# Patient Record
Sex: Female | Born: 1980 | Race: Black or African American | Hispanic: No | Marital: Single | Smoking: Never smoker
Health system: Southern US, Community
[De-identification: ages and names within clinical notes are randomized; demographics above are authoritative.]

## PROBLEM LIST (undated history)

## (undated) ENCOUNTER — Emergency Department (HOSPITAL_BASED_OUTPATIENT_CLINIC_OR_DEPARTMENT_OTHER): Discharge status: Absent without leave | Payer: No Typology Code available for payment source

## (undated) ENCOUNTER — Emergency Department (HOSPITAL_BASED_OUTPATIENT_CLINIC_OR_DEPARTMENT_OTHER): Admission: EM | Payer: No Typology Code available for payment source | Source: Home / Self Care

## (undated) DIAGNOSIS — G43909 Migraine, unspecified, not intractable, without status migrainosus: Secondary | ICD-10-CM

## (undated) HISTORY — PX: TUBAL LIGATION: SHX77

## (undated) HISTORY — PX: HERNIA REPAIR: SHX51

---
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
## 2002-09-10 ENCOUNTER — Other Ambulatory Visit: Admission: RE | Admit: 2002-09-10 | Discharge: 2002-09-10 | Payer: Self-pay | Admitting: Nephrology

## 2003-09-07 ENCOUNTER — Other Ambulatory Visit: Admission: RE | Admit: 2003-09-07 | Discharge: 2003-09-07 | Payer: Self-pay | Admitting: Nephrology

## 2009-09-22 ENCOUNTER — Emergency Department (HOSPITAL_BASED_OUTPATIENT_CLINIC_OR_DEPARTMENT_OTHER): Admission: EM | Admit: 2009-09-22 | Discharge: 2009-09-22 | Payer: Self-pay | Admitting: Emergency Medicine

## 2009-09-22 ENCOUNTER — Ambulatory Visit: Payer: Self-pay | Admitting: Interventional Radiology

## 2010-11-24 ENCOUNTER — Encounter: Payer: Self-pay | Admitting: *Deleted

## 2010-11-24 DIAGNOSIS — M2669 Other specified disorders of temporomandibular joint: Secondary | ICD-10-CM | POA: Insufficient documentation

## 2010-11-24 DIAGNOSIS — Z79899 Other long term (current) drug therapy: Secondary | ICD-10-CM | POA: Insufficient documentation

## 2010-11-24 DIAGNOSIS — J45909 Unspecified asthma, uncomplicated: Secondary | ICD-10-CM | POA: Insufficient documentation

## 2010-11-24 DIAGNOSIS — B9789 Other viral agents as the cause of diseases classified elsewhere: Secondary | ICD-10-CM | POA: Insufficient documentation

## 2010-11-24 NOTE — ED Notes (Signed)
Pt sts she has had sore throat and bilat ear pain x1.5 weeks. Pt also c/o bilat knee pain after falling on Tuesday.

## 2010-11-25 ENCOUNTER — Emergency Department (HOSPITAL_BASED_OUTPATIENT_CLINIC_OR_DEPARTMENT_OTHER)
Admission: EM | Admit: 2010-11-25 | Discharge: 2010-11-25 | Disposition: A | Discharge status: Home / Self Care | Payer: Self-pay | Attending: Emergency Medicine | Admitting: Emergency Medicine

## 2010-11-25 DIAGNOSIS — B349 Viral infection, unspecified: Secondary | ICD-10-CM

## 2010-11-25 DIAGNOSIS — M26629 Arthralgia of temporomandibular joint, unspecified side: Secondary | ICD-10-CM

## 2010-11-25 MED ORDER — CYCLOBENZAPRINE HCL 10 MG PO TABS: 10.0000 mg | ORAL_TABLET | Freq: Three times a day (TID) | ORAL | Status: AC | PRN

## 2010-11-25 MED ORDER — HYDROCODONE-ACETAMINOPHEN 5-325 MG PO TABS: 1.0000 | ORAL_TABLET | Freq: Four times a day (QID) | ORAL | Status: AC | PRN

## 2010-11-25 NOTE — ED Notes (Signed)
Pt presents to ED today with c/o bilat ear pain and sore throat for the past week.  Pt reports no home meds PTA.

## 2010-11-25 NOTE — ED Provider Notes (Signed)
History     CSN: [id number] Arrival date & time: 11/25/2010 12:41 AM  Chief Complaint  Patient presents with  . Sore Throat    (Consider location/radiation/quality/duration/timing/severity/associated sxs/prior treatment) HPI This is a 30 year old black female with about a week and a half history of sore throat. The sore throat has been persistent. It was accompanied by cough and nasal congestion and it is exacerbated by swallowing. She's had a decreased appetite. She also complains of bilateral ear pain which on further questioning is actually bilateral temporomandibular joint pain. Sore throat is moderate. She also fell 4 days ago injuring both knees. She is having pain under her patellas bilaterally. She is able to ambulate.  Past Medical History  Diagnosis Date  . Asthma     Past Surgical History  Procedure Date  . Hernia repair   . Tubal ligation     No family history on file.  History  Substance Use Topics  . Smoking status: Never Smoker   . Smokeless tobacco: Not on file  . Alcohol Use: No    OB History    Grav Para Term Preterm Abortions TAB SAB Ect Mult Living                  Review of Systems  All other systems reviewed and are negative.    Allergies  Review of patient's allergies indicates no known allergies.  Home Medications   Current Outpatient Rx  Name Route Sig Dispense Refill  . BUDESONIDE-FORMOTEROL FUMARATE 160-4.5 MCG/ACT IN AERO Inhalation Inhale 2 puffs into the lungs 2 (two) times daily.      Marland Kitchen VITAMIN D HIGH POTENCY PO Oral Take 1 capsule by mouth daily.      Marland Kitchen CITALOPRAM HYDROBROMIDE 10 MG PO TABS Oral Take 10 mg by mouth daily. Unknown dose     . SUMATRIPTAN SUCCINATE 6 MG/0.5ML  SOLN Subcutaneous Inject 6 mg into the skin every 2 (two) hours as needed. For migraine      . TOPIRAMATE 25 MG PO TABS Oral Take 100 mg by mouth at bedtime.       BP 118/73  Pulse 63  Temp(Src) 98 F (36.7 C) (Oral)  Resp 18  SpO2 99%  LMP  11/20/2010  Physical Exam General: Well-developed, well-nourished female in no acute distress; appearance consistent with age of record HENT: normocephalic, atraumatic; TMs pearly gray with light reflex bilaterally; no pharyngeal exudate or erythema; bilateral temporomandibular joint tenderness and pain with movement of jaw Eyes: Normal appearance  Neck: supple; no cervical lymphadenopathy Heart: regular rate and rhythm; no murmurs, rubs or gallops Lungs: clear to auscultation bilaterally Abdomen: soft; nontender; nondistended Extremities: No deformity; full range of motion; mild patellar tenderness bilaterally without swelling or deformity Neurologic: Awake, alert and oriented;motor function intact in all extremities and symmetric; no facial droop Skin: Warm and dry   ED Course  Procedures (including critical care time)    MDM          Hanley Seamen, MD 11/25/10 2130

## 2012-11-17 ENCOUNTER — Emergency Department (HOSPITAL_BASED_OUTPATIENT_CLINIC_OR_DEPARTMENT_OTHER)
Admission: EM | Admit: 2012-11-17 | Discharge: 2012-11-18 | Disposition: A | Discharge status: Home / Self Care | Payer: Non-veteran care | Attending: Emergency Medicine | Admitting: Emergency Medicine

## 2012-11-17 ENCOUNTER — Encounter (HOSPITAL_BASED_OUTPATIENT_CLINIC_OR_DEPARTMENT_OTHER): Payer: Self-pay | Admitting: *Deleted

## 2012-11-17 DIAGNOSIS — G43909 Migraine, unspecified, not intractable, without status migrainosus: Secondary | ICD-10-CM | POA: Insufficient documentation

## 2012-11-17 DIAGNOSIS — Z79899 Other long term (current) drug therapy: Secondary | ICD-10-CM | POA: Insufficient documentation

## 2012-11-17 DIAGNOSIS — J45909 Unspecified asthma, uncomplicated: Secondary | ICD-10-CM | POA: Insufficient documentation

## 2012-11-17 DIAGNOSIS — IMO0002 Reserved for concepts with insufficient information to code with codable children: Secondary | ICD-10-CM | POA: Insufficient documentation

## 2012-11-17 DIAGNOSIS — R5381 Other malaise: Secondary | ICD-10-CM | POA: Insufficient documentation

## 2012-11-17 HISTORY — DX: Migraine, unspecified, not intractable, without status migrainosus: G43.909

## 2012-11-17 MED ORDER — KETOROLAC TROMETHAMINE 30 MG/ML IJ SOLN
30.0000 mg | Freq: Once | INTRAMUSCULAR | Status: AC
  Administered 2012-11-17: 30 mg via INTRAVENOUS
  Filled 2012-11-17: qty 1

## 2012-11-17 MED ORDER — SODIUM CHLORIDE 0.9 % IV BOLUS (SEPSIS)
1000.0000 mL | INTRAVENOUS | Status: AC
  Administered 2012-11-17: 1000 mL via INTRAVENOUS

## 2012-11-17 MED ORDER — PROCHLORPERAZINE EDISYLATE 5 MG/ML IJ SOLN
10.0000 mg | Freq: Once | INTRAMUSCULAR | Status: DC
  Filled 2012-11-17: qty 2

## 2012-11-17 MED ORDER — DIPHENHYDRAMINE HCL 50 MG/ML IJ SOLN
25.0000 mg | Freq: Once | INTRAMUSCULAR | Status: AC
  Administered 2012-11-17: 25 mg via INTRAVENOUS
  Filled 2012-11-17: qty 1

## 2012-11-17 MED ORDER — METOCLOPRAMIDE HCL 5 MG/ML IJ SOLN
10.0000 mg | Freq: Once | INTRAMUSCULAR | Status: AC
  Administered 2012-11-17: 10 mg via INTRAVENOUS

## 2012-11-17 MED ORDER — METOCLOPRAMIDE HCL 5 MG/ML IJ SOLN
INTRAMUSCULAR | Status: AC
  Filled 2012-11-17: qty 2

## 2012-11-17 NOTE — ED Notes (Signed)
Pt reports migraine HA that began today, pt admits to nausea denies vomiting. Pt w/ hx of migraines, pt states "she hasn't taken her medication for migraines in a while."

## 2012-11-17 NOTE — ED Provider Notes (Signed)
CSN: [id number]     Arrival date & time 11/17/12  2013 History   First MD Initiated Contact with Patient 11/17/12 2239     Chief Complaint  Patient presents with  . Migraine   (Consider location/radiation/quality/duration/timing/severity/associated sxs/prior Treatment) HPI Pt is a 32yo female with hx of migraines c/o gradual onset headache that started behind both eyes. Pain is constant, aching, throbbing, radiating to back of head, 5/10 now, was 9/10 earlier today. Light makes pain worse. States HA feels like previous migraines.  Normally uses a Imitrex but states she did not have any with her today.  Did not try any pain medication PTA.  Pt does have a neurologist she follows up with but it has been several months since the last time she needed to come to ER for pain medication. Denies recent head trauma. Denies fever, n/v/d.  Past Medical History  Diagnosis Date  . Asthma   . Migraines    Past Surgical History  Procedure Laterality Date  . Hernia repair    . Tubal ligation     History reviewed. No pertinent family history. History  Substance Use Topics  . Smoking status: Never Smoker   . Smokeless tobacco: Not on file  . Alcohol Use: No   OB History   Grav Para Term Preterm Abortions TAB SAB Ect Mult Living                 Review of Systems  Constitutional: Positive for fatigue. Negative for fever, chills and diaphoresis.  Eyes: Positive for photophobia. Negative for pain and visual disturbance.  Respiratory: Negative for shortness of breath.   Neurological: Positive for headaches. Negative for dizziness, syncope, weakness, light-headedness and numbness.  All other systems reviewed and are negative.    Allergies  Review of patient's allergies indicates no known allergies.  Home Medications   Current Outpatient Rx  Name  Route  Sig  Dispense  Refill  . budesonide-formoterol (SYMBICORT) 160-4.5 MCG/ACT inhaler   Inhalation   Inhale 2 puffs into the lungs 2 (two)  times daily.           . Cholecalciferol (VITAMIN D HIGH POTENCY PO)   Oral   Take 1 capsule by mouth daily.           . citalopram (CELEXA) 10 MG tablet   Oral   Take 10 mg by mouth daily. Unknown dose          . SUMAtriptan (IMITREX) 6 MG/0.5ML SOLN   Subcutaneous   Inject 6 mg into the skin every 2 (two) hours as needed. For migraine           . topiramate (TOPAMAX) 25 MG tablet   Oral   Take 100 mg by mouth at bedtime.           BP 127/92  Pulse 72  Temp(Src) 98.5 F (36.9 C) (Oral)  Resp 16  Ht 5\' 1"  (1.549 m)  Wt 133 lb (60.328 kg)  BMI 25.14 kg/m2  SpO2 100%  LMP 11/10/2012 Physical Exam  Nursing note and vitals reviewed. Constitutional: She is oriented to person, place, and time. She appears well-developed and well-nourished. No distress.  Pt lying in exam bed, blanket over head.   HENT:  Head: Normocephalic and atraumatic.  Eyes: Conjunctivae and EOM are normal. Pupils are equal, round, and reactive to light. Right eye exhibits no discharge. Left eye exhibits no discharge. No scleral icterus.  Neck: Normal range of motion. Neck supple.  No  nuchal rigidity or meningeal signs.   Cardiovascular: Normal rate, regular rhythm and normal heart sounds.   Pulmonary/Chest: Effort normal and breath sounds normal. No respiratory distress. She has no wheezes. She has no rales. She exhibits no tenderness.  Abdominal: Soft. Bowel sounds are normal. She exhibits no distension and no mass. There is no tenderness. There is no rebound and no guarding.  Musculoskeletal: Normal range of motion.  Neurological: She is alert and oriented to person, place, and time. No cranial nerve deficit.  Skin: Skin is warm and dry. She is not diaphoretic.    ED Course  Procedures (including critical care time) Labs Review Labs Reviewed - No data to display Imaging Review No results found.  MDM   1. Migraine    Pt presenting with HA, describes as same as previous migraines.   Not concerned for CVA/TIA or SAH. Neuro exam: nl.  Gave migraine cocktail: Tx-Reglan, benadryl, Toradol and fluids.  Pt stated she felt much better and felt comfortable being discharged home.    All questions answered and concerns addressed. Will discharge pt home and have pt f/u with [facility] info provided, and previously established neurologist. Return precautions given. Pt verbalized understanding and agreement with tx plan. Vitals: unremarkable. Discharged in stable condition.       Junius Finner, PA-C 11/18/12 [phone]

## 2012-11-18 NOTE — ED Provider Notes (Signed)
Medical screening examination/treatment/procedure(s) were performed by non-physician practitioner and as supervising physician I was immediately available for consultation/collaboration.   Rolan Bucco, MD 11/18/12 [phone]

## 2013-05-11 ENCOUNTER — Encounter (HOSPITAL_COMMUNITY): Payer: Self-pay | Admitting: Emergency Medicine

## 2013-05-11 ENCOUNTER — Emergency Department (HOSPITAL_COMMUNITY)
Admission: EM | Admit: 2013-05-11 | Discharge: 2013-05-11 | Disposition: A | Discharge status: Home / Self Care | Payer: Non-veteran care | Attending: Emergency Medicine | Admitting: Emergency Medicine

## 2013-05-11 DIAGNOSIS — Z79899 Other long term (current) drug therapy: Secondary | ICD-10-CM | POA: Insufficient documentation

## 2013-05-11 DIAGNOSIS — J45909 Unspecified asthma, uncomplicated: Secondary | ICD-10-CM | POA: Insufficient documentation

## 2013-05-11 DIAGNOSIS — IMO0002 Reserved for concepts with insufficient information to code with codable children: Secondary | ICD-10-CM | POA: Insufficient documentation

## 2013-05-11 DIAGNOSIS — G43909 Migraine, unspecified, not intractable, without status migrainosus: Secondary | ICD-10-CM | POA: Insufficient documentation

## 2013-05-11 MED ORDER — METOCLOPRAMIDE HCL 5 MG/ML IJ SOLN
10.0000 mg | Freq: Once | INTRAMUSCULAR | Status: AC
  Administered 2013-05-11: 10 mg via INTRAVENOUS
  Filled 2013-05-11: qty 2

## 2013-05-11 MED ORDER — DIPHENHYDRAMINE HCL 50 MG/ML IJ SOLN
12.5000 mg | Freq: Once | INTRAMUSCULAR | Status: AC
  Administered 2013-05-11: 12.5 mg via INTRAVENOUS
  Filled 2013-05-11: qty 1

## 2013-05-11 MED ORDER — DEXAMETHASONE SODIUM PHOSPHATE 10 MG/ML IJ SOLN
10.0000 mg | Freq: Once | INTRAMUSCULAR | Status: AC
  Administered 2013-05-11: 10 mg via INTRAVENOUS
  Filled 2013-05-11: qty 1

## 2013-05-11 MED ORDER — KETOROLAC TROMETHAMINE 30 MG/ML IJ SOLN
30.0000 mg | Freq: Once | INTRAMUSCULAR | Status: AC
  Administered 2013-05-11: 30 mg via INTRAVENOUS
  Filled 2013-05-11: qty 1

## 2013-05-11 MED ORDER — SODIUM CHLORIDE 0.9 % IV BOLUS (SEPSIS)
1000.0000 mL | Freq: Once | INTRAVENOUS | Status: AC
  Administered 2013-05-11: 1000 mL via INTRAVENOUS

## 2013-05-11 NOTE — Discharge Instructions (Signed)
Migraine Headache A migraine headache is an intense, throbbing pain on one or both sides of your head. A migraine can last for 30 minutes to several hours. CAUSES  The exact cause of a migraine headache is not always known. However, a migraine may be caused when nerves in the brain become irritated and release chemicals that cause inflammation. This causes pain. Certain things may also trigger migraines, such as:  Alcohol.  Smoking.  Stress.  Menstruation.  Aged cheeses.  Foods or drinks that contain nitrates, glutamate, aspartame, or tyramine.  Lack of sleep.  Chocolate.  Caffeine.  Hunger.  Physical exertion.  Fatigue.  Medicines used to treat chest pain (nitroglycerine), birth control pills, estrogen, and some blood pressure medicines. SIGNS AND SYMPTOMS  Pain on one or both sides of your head.  Pulsating or throbbing pain.  Severe pain that prevents daily activities.  Pain that is aggravated by any physical activity.  Nausea, vomiting, or both.  Dizziness.  Pain with exposure to bright lights, loud noises, or activity.  General sensitivity to bright lights, loud noises, or smells. Before you get a migraine, you may get warning signs that a migraine is coming (aura). An aura may include:  Seeing flashing lights.  Seeing bright spots, halos, or zig-zag lines.  Having tunnel vision or blurred vision.  Having feelings of numbness or tingling.  Having trouble talking.  Having muscle weakness. DIAGNOSIS  A migraine headache is often diagnosed based on:  Symptoms.  Physical exam.  A CT scan or MRI of your head. These imaging tests cannot diagnose migraines, but they can help rule out other causes of headaches. TREATMENT Medicines may be given for pain and nausea. Medicines can also be given to help prevent recurrent migraines.  HOME CARE INSTRUCTIONS  Only take over-the-counter or prescription medicines for pain or discomfort as directed by your  health care provider. The use of long-term narcotics is not recommended.  Lie down in a dark, quiet room when you have a migraine.  Keep a journal to find out what may trigger your migraine headaches. For example, write down:  What you eat and drink.  How much sleep you get.  Any change to your diet or medicines.  Limit alcohol consumption.  Quit smoking if you smoke.  Get 7 9 hours of sleep, or as recommended by your health care provider.  Limit stress.  Keep lights dim if bright lights bother you and make your migraines worse. SEEK IMMEDIATE MEDICAL CARE IF:   Your migraine becomes severe.  You have a fever.  You have a stiff neck.  You have vision loss.  You have muscular weakness or loss of muscle control.  You start losing your balance or have trouble walking.  You feel faint or pass out.  You have severe symptoms that are different from your first symptoms. MAKE SURE YOU:   Understand these instructions.  Will watch your condition.  Will get help right away if you are not doing well or get worse. Document Released: 01/29/2005 Document Revised: 11/19/2012 Document Reviewed: 10/06/2012 [employer] Patient Information 2014 [employer].  

## 2013-05-11 NOTE — ED Provider Notes (Signed)
TIME SEEN: 7:15 PM  CHIEF COMPLAINT: Migraine headache  HPI: Patient is a 33 year old female with a history of migraine headaches and asthma who presents emergency department with complaints of gradual onset right-sided sharp headache is consistent with her prior migraines. She normally takes Imitrex at home for her migraines but states she did not take this medication because she was at work today. She tried taking Tylenol with no relief. She is having photophobia and nausea and vomiting. She states she feels numbness in her hands and feet bilaterally. No focal weakness. No history of head injury. She is not on anticoagulation. No fevers, neck pain or neck stiffness.  ROS: See HPI Constitutional: no fever  Eyes: no drainage  ENT: no runny nose   Cardiovascular:  no chest pain  Resp: no SOB  GI: no vomiting GU: no dysuria Integumentary: no rash  Allergy: no hives  Musculoskeletal: no leg swelling  Neurological: no slurred speech ROS otherwise negative  PAST MEDICAL HISTORY/PAST SURGICAL HISTORY:  Past Medical History  Diagnosis Date  . Asthma   . Migraines     MEDICATIONS:  Prior to Admission medications   Medication Sig Start Date End Date Taking? Authorizing Provider  budesonide-formoterol (SYMBICORT) 160-4.5 MCG/ACT inhaler Inhale 2 puffs into the lungs 2 (two) times daily.      Historical Provider, MD  Cholecalciferol (VITAMIN D HIGH POTENCY PO) Take 1 capsule by mouth daily.      Historical Provider, MD  citalopram (CELEXA) 10 MG tablet Take 10 mg by mouth daily. Unknown dose     Historical Provider, MD  SUMAtriptan (IMITREX) 6 MG/0.5ML SOLN Inject 6 mg into the skin every 2 (two) hours as needed. For migraine      Historical Provider, MD  topiramate (TOPAMAX) 25 MG tablet Take 100 mg by mouth at bedtime.     Historical Provider, MD    ALLERGIES:  No Known Allergies  SOCIAL HISTORY:  History  Substance Use Topics  . Smoking status: Never Smoker   . Smokeless tobacco:  Not on file  . Alcohol Use: No    FAMILY HISTORY: No family history on file.  EXAM: BP 142/86  Pulse 86  Temp(Src) 98.4 F (36.9 C) (Oral)  Resp 22  SpO2 99% CONSTITUTIONAL: Alert and oriented and responds appropriately to questions. Well-appearing; well-nourished HEAD: Normocephalic EYES: Conjunctivae clear, PERRL ENT: normal nose; no rhinorrhea; moist mucous membranes; pharynx without lesions noted NECK: Supple, no meningismus, no LAD  CARD: RRR; S1 and S2 appreciated; no murmurs, no clicks, no rubs, no gallops RESP: Normal chest excursion without splinting or tachypnea; breath sounds clear and equal bilaterally; no wheezes, no rhonchi, no rales,  ABD/GI: Normal bowel sounds; non-distended; soft, non-tender, no rebound, no guarding BACK:  The back appears normal and is non-tender to palpation, there is no CVA tenderness EXT: Normal ROM in all joints; non-tender to palpation; no edema; normal capillary refill; no cyanosis    SKIN: Normal color for age and race; warm NEURO: Moves all extremities equally, sensation to light touch intact diffusely, cranial nerves II through XII intact, patient has poor cooperation with exam PSYCH: Grooming and personal hygiene are appropriate. Patient demonstrates histrionic behavior.  MEDICAL DECISION MAKING: Patient here with her typical migraine headache. She has been in the emergency department before for similar headache and received Reglan, Toradol, Benadryl, IV fluids with good relief. We'll repeat these medications today. I am not concerned for any infectious etiology, intracranial hemorrhage or stroke. She is neurologically intact on  exam.  ED PROGRESS: Patient reports her headache is now completely gone after migraine cocktail. She states she has Imitrex at home. We'll discharge patient home with return precautions and supportive care instructions. She verbalized understanding is comfortable with this plan.     Layla Maw Ward, DO 05/11/13  2041

## 2013-05-11 NOTE — ED Notes (Signed)
Pt c/o of migraine that started 9am. Hx of same. Nausea, photosensitivity, tingling arms/legs.

## 2013-05-11 NOTE — ED Notes (Signed)
Bed: WA07 Expected date:  Expected time:  Means of arrival:  Comments: EMS 

## 2015-01-08 ENCOUNTER — Emergency Department (HOSPITAL_BASED_OUTPATIENT_CLINIC_OR_DEPARTMENT_OTHER)
Admission: EM | Admit: 2015-01-08 | Discharge: 2015-01-09 | Disposition: A | Discharge status: Home / Self Care | Payer: Non-veteran care | Attending: Emergency Medicine | Admitting: Emergency Medicine

## 2015-01-08 ENCOUNTER — Encounter (HOSPITAL_BASED_OUTPATIENT_CLINIC_OR_DEPARTMENT_OTHER): Payer: Self-pay | Admitting: Adult Health

## 2015-01-08 DIAGNOSIS — Z79899 Other long term (current) drug therapy: Secondary | ICD-10-CM | POA: Diagnosis not present

## 2015-01-08 DIAGNOSIS — Z8679 Personal history of other diseases of the circulatory system: Secondary | ICD-10-CM | POA: Diagnosis not present

## 2015-01-08 DIAGNOSIS — M79641 Pain in right hand: Secondary | ICD-10-CM | POA: Diagnosis not present

## 2015-01-08 DIAGNOSIS — M79642 Pain in left hand: Secondary | ICD-10-CM | POA: Diagnosis present

## 2015-01-08 DIAGNOSIS — G629 Polyneuropathy, unspecified: Secondary | ICD-10-CM | POA: Diagnosis not present

## 2015-01-08 DIAGNOSIS — J45909 Unspecified asthma, uncomplicated: Secondary | ICD-10-CM | POA: Diagnosis not present

## 2015-01-08 MED ORDER — HYDROCODONE-ACETAMINOPHEN 5-325 MG PO TABS: 1.0000 | ORAL_TABLET | Freq: Four times a day (QID) | ORAL | Status: DC | PRN

## 2015-01-08 NOTE — Discharge Instructions (Signed)

## 2015-01-08 NOTE — ED Provider Notes (Signed)
CSN: [id number]     Arrival date & time 01/08/15  2210 History  By signing my name below, I, Budd PalmerVanessa Prueter, attest that this documentation has been prepared under the direction and in the presence of Paula LibraJohn Nija Koopman, MD. Electronically Signed: Budd PalmerVanessa Prueter, ED Scribe. 01/08/2015. 11:48 PM.     Chief Complaint  Patient presents with  . Hand Pain   The history is provided by the patient. No language interpreter was used.   HPI Comments: Ashley Bell is a 34 y.o. female who presents to the Emergency Department complaining of worsening, constant, bilateral hand pain, numbness, paresthesias and weakness onset 2 months ago. She describes the paresthesias as "needles sticking into" her arms. Symptoms are much worse on the left and she notes that her left hand "feels ice cold" and that her right hand has occasional sharp pains. The pain and paresthesias are in the glovelike pattern. Pain is worse with palpation or movement. She rates her pain as a 9 out of 10 at its worst. She also reports that she can "barely wring out a washcloth" with her left hand. Pt denies numbness or weakness in the face trunk and lower extremities.   Past Medical History  Diagnosis Date  . Asthma   . Migraines    Past Surgical History  Procedure Laterality Date  . Hernia repair    . Tubal ligation     History reviewed. No pertinent family history. Social History  Substance Use Topics  . Smoking status: Never Smoker   . Smokeless tobacco: None  . Alcohol Use: No   OB History    No data available     Review of Systems  All other systems reviewed and are negative.   Allergies  Review of patient's allergies indicates no known allergies.  Home Medications   Prior to Admission medications   Medication Sig Start Date End Date Taking? Authorizing Provider  ALBUTEROL IN Inhale into the lungs.   Yes Historical Provider, MD  CHLORTHALIDONE PO Take by mouth.   Yes Historical Provider, MD  Loratadine (CLARITIN PO)  Take by mouth.   Yes Historical Provider, MD  potassium chloride SA (K-DUR,KLOR-CON) 20 MEQ tablet Take 40 mEq by mouth 2 (two) times daily.   Yes Historical Provider, MD  HYDROcodone-acetaminophen (NORCO/VICODIN) 5-325 MG tablet Take 1-2 tablets by mouth every 6 (six) hours as needed (for pain). 01/08/15   Mackie Holness, MD   BP 155/103 mmHg  Pulse 73  Temp(Src) 98.4 F (36.9 C) (Oral)  Resp 18  Ht 5\' 1"  (1.549 m)  Wt 172 lb 1 oz (78.047 kg)  BMI 32.53 kg/m2  SpO2 100%  LMP 11/16/2014 (Approximate)   Physical Exam General: Well-developed, well-nourished female in no acute distress; appearance consistent with age of record HENT: normocephalic; atraumatic Eyes: normal appearance Neck: supple Heart: regular rate and rhythm Lungs: clear to auscultation bilaterally Abdomen: soft; nondistended; nontender Extremities: No deformity; full range of motion except left hand and wrist limited by muscle weakness Neurologic: Awake, alert and oriented; no facial droop; +3/5 strength in the L hand, +5/5 strength in the right hand; decreased sensation of the left hand and wrist in a glove-like pattern Skin: Warm and dry Psychiatric: Normal mood and affect   ED Course  Procedures   MDM  The patient was advised that her symptoms are suggestive of a neuropathy and definitive diagnosis will require evaluation by a neurologist. We will refer for follow-up and treat her pain in the meantime.  Final diagnoses:  Peripheral polyneuropathy (HCC)   I personally performed the services described in this documentation, which was scribed in my presence. The recorded information has been reviewed and is accurate.   Paula Libra, MD 01/08/15 [phone]

## 2015-01-08 NOTE — ED Notes (Signed)
Presents with bilateral hand pain, numbness and difficulty with dexterity and picking up things. Began 2 months ago.

## 2015-01-17 ENCOUNTER — Emergency Department (HOSPITAL_BASED_OUTPATIENT_CLINIC_OR_DEPARTMENT_OTHER)
Admission: EM | Admit: 2015-01-17 | Discharge: 2015-01-17 | Disposition: A | Discharge status: Home / Self Care | Payer: No Typology Code available for payment source | Attending: Emergency Medicine | Admitting: Emergency Medicine

## 2015-01-17 ENCOUNTER — Emergency Department (HOSPITAL_BASED_OUTPATIENT_CLINIC_OR_DEPARTMENT_OTHER): Payer: No Typology Code available for payment source

## 2015-01-17 ENCOUNTER — Encounter (HOSPITAL_BASED_OUTPATIENT_CLINIC_OR_DEPARTMENT_OTHER): Payer: Self-pay | Admitting: *Deleted

## 2015-01-17 DIAGNOSIS — S59901A Unspecified injury of right elbow, initial encounter: Secondary | ICD-10-CM | POA: Insufficient documentation

## 2015-01-17 DIAGNOSIS — S161XXA Strain of muscle, fascia and tendon at neck level, initial encounter: Secondary | ICD-10-CM | POA: Diagnosis not present

## 2015-01-17 DIAGNOSIS — J45909 Unspecified asthma, uncomplicated: Secondary | ICD-10-CM | POA: Insufficient documentation

## 2015-01-17 DIAGNOSIS — Z8679 Personal history of other diseases of the circulatory system: Secondary | ICD-10-CM | POA: Diagnosis not present

## 2015-01-17 DIAGNOSIS — S39012A Strain of muscle, fascia and tendon of lower back, initial encounter: Secondary | ICD-10-CM

## 2015-01-17 DIAGNOSIS — S199XXA Unspecified injury of neck, initial encounter: Secondary | ICD-10-CM | POA: Diagnosis present

## 2015-01-17 DIAGNOSIS — M62830 Muscle spasm of back: Secondary | ICD-10-CM | POA: Diagnosis not present

## 2015-01-17 DIAGNOSIS — Z79899 Other long term (current) drug therapy: Secondary | ICD-10-CM | POA: Insufficient documentation

## 2015-01-17 DIAGNOSIS — S299XXA Unspecified injury of thorax, initial encounter: Secondary | ICD-10-CM | POA: Insufficient documentation

## 2015-01-17 DIAGNOSIS — Y9389 Activity, other specified: Secondary | ICD-10-CM | POA: Insufficient documentation

## 2015-01-17 DIAGNOSIS — Y92410 Unspecified street and highway as the place of occurrence of the external cause: Secondary | ICD-10-CM | POA: Insufficient documentation

## 2015-01-17 DIAGNOSIS — Y998 Other external cause status: Secondary | ICD-10-CM | POA: Diagnosis not present

## 2015-01-17 MED ORDER — HYDROCODONE-ACETAMINOPHEN 5-325 MG PO TABS
1.0000 | ORAL_TABLET | Freq: Once | ORAL | Status: AC
  Administered 2015-01-17: 1 via ORAL
  Filled 2015-01-17: qty 1

## 2015-01-17 MED ORDER — HYDROCODONE-ACETAMINOPHEN 5-325 MG PO TABS: 1.0000 | ORAL_TABLET | Freq: Four times a day (QID) | ORAL | Status: DC | PRN

## 2015-01-17 MED ORDER — CYCLOBENZAPRINE HCL 10 MG PO TABS
10.0000 mg | ORAL_TABLET | Freq: Once | ORAL | Status: AC
  Administered 2015-01-17: 10 mg via ORAL
  Filled 2015-01-17: qty 1

## 2015-01-17 MED ORDER — CYCLOBENZAPRINE HCL 10 MG PO TABS: 10.0000 mg | ORAL_TABLET | Freq: Two times a day (BID) | ORAL | Status: DC | PRN
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
## 2015-01-17 NOTE — Discharge Instructions (Signed)

## 2015-06-18 ENCOUNTER — Ambulatory Visit (HOSPITAL_COMMUNITY)
Admission: EM | Admit: 2015-06-18 | Discharge: 2015-06-18 | Discharge status: Against Medical Advice | Payer: Non-veteran care | Attending: Family Medicine | Admitting: Family Medicine

## 2015-06-18 ENCOUNTER — Encounter (HOSPITAL_COMMUNITY): Payer: Self-pay | Admitting: Emergency Medicine

## 2015-06-18 DIAGNOSIS — G43909 Migraine, unspecified, not intractable, without status migrainosus: Secondary | ICD-10-CM | POA: Diagnosis not present

## 2015-06-18 NOTE — Discharge Instructions (Signed)
Recurrent Migraine Headache A migraine headache is an intense, throbbing pain on one or both sides of your head. Recurrent migraines keep coming back. A migraine can last for 30 minutes to several hours. CAUSES  The exact cause of a migraine headache is not always known. However, a migraine may be caused when nerves in the brain become irritated and release chemicals that cause inflammation. This causes pain. Certain things may also trigger migraines, such as:   Alcohol.  Smoking.  Stress.  Menstruation.  Aged cheeses.  Foods or drinks that contain nitrates, glutamate, aspartame, or tyramine.  Lack of sleep.  Chocolate.  Caffeine.  Hunger.  Physical exertion.  Fatigue.  Medicines used to treat chest pain (nitroglycerine), birth control pills, estrogen, and some blood pressure medicines. SYMPTOMS   Pain on one or both sides of your head.  Pulsating or throbbing pain.  Severe pain that prevents daily activities.  Pain that is aggravated by any physical activity.  Nausea, vomiting, or both.  Dizziness.  Pain with exposure to bright lights, loud noises, or activity.  General sensitivity to bright lights, loud noises, or smells. Before you get a migraine, you may get warning signs that a migraine is coming (aura). An aura may include:  Seeing flashing lights.  Seeing bright spots, halos, or zigzag lines.  Having tunnel vision or blurred vision.  Having feelings of numbness or tingling.  Having trouble talking.  Having muscle weakness. DIAGNOSIS  A recurrent migraine headache is often diagnosed based on:  Symptoms.  Physical examination.  A CT scan or MRI of your head. These imaging tests cannot diagnose migraines but can help rule out other causes of headaches.  TREATMENT  Medicines may be given for pain and nausea. Medicines can also be given to help prevent recurrent migraines. HOME CARE INSTRUCTIONS  Only take over-the-counter or prescription  medicines for pain or discomfort as directed by your health care provider. The use of long-term narcotics is not recommended.  Lie down in a dark, quiet room when you have a migraine.  Keep a journal to find out what may trigger your migraine headaches. For example, write down:  What you eat and drink.  How much sleep you get.  Any change to your diet or medicines.  Limit alcohol consumption.  Quit smoking if you smoke.  Get 7-9 hours of sleep, or as recommended by your health care provider.  Limit stress.  Keep lights dim if bright lights bother you and make your migraines worse. SEEK MEDICAL CARE IF:   You do not get relief from the medicines given to you.  You have a recurrence of pain.  You have a fever. SEEK IMMEDIATE MEDICAL CARE IF:  Your migraine becomes severe.  You have a stiff neck.  You have loss of vision.  You have muscular weakness or loss of muscle control.  You start losing your balance or have trouble walking.  You feel faint or pass out.  You have severe symptoms that are different from your first symptoms. MAKE SURE YOU:   Understand these instructions.  Will watch your condition.  Will get help right away if you are not doing well or get worse.   This information is not intended to replace advice given to you by your health care provider. Make sure you discuss any questions you have with your health care provider.   Document Released: 10/24/2000 Document Revised: 02/19/2014 Document Reviewed: 10/06/2012 Elsevier Interactive Patient Education 2016 [employer]  

## 2015-06-18 NOTE — ED Provider Notes (Signed)
CSN: [id number]     Arrival date & time 06/18/15  1946 History   First MD Initiated Contact with Patient 06/18/15 2022     Chief Complaint  Patient presents with  . Migraine   (Consider location/radiation/quality/duration/timing/severity/associated sxs/prior Treatment) HPI Comments: 35 year old female complaining of a migraine headache for one week. She states it got worse at 12:00 today. She has a treating physician who provides her with Vicodin for her headaches. She states it is not helping. She takes no other medications for her migraines. Reading in past records she has been treated with Imitrex but apparently that is no longer part of her treatment plan now. She has associated photophobia with nausea and vomiting. Denies problems with vision, speech, hearing, swallowing, focal weakness or paresthesias. Denies chest pain, shortness of breath. She states this is not the worst headache of her life.  During the interview the patient did not move. She remained lying right lateral recumbent. She speaks softly and some of her speech is difficult to understand. Surprisingly the patient did not allow me to examine her. She refused to move, sit on the end of the table or cooperate in any other way. Patient states that she was physically incapable of sitting up or ambulating due to her headache syndrome. She states that if she were to try to sit up this would only "piss me off".   Past Medical History  Diagnosis Date  . Asthma   . Migraines    Past Surgical History  Procedure Laterality Date  . Hernia repair    . Tubal ligation     No family history on file. Social History  Substance Use Topics  . Smoking status: Never Smoker   . Smokeless tobacco: None  . Alcohol Use: No   OB History    No data available     Review of Systems  Constitutional: Positive for activity change. Negative for fever and chills.  HENT: Negative for congestion, sinus pressure and trouble swallowing.   Eyes:  Positive for photophobia. Negative for visual disturbance.  Respiratory: Negative.  Negative for cough and shortness of breath.   Cardiovascular: Negative for chest pain.  Gastrointestinal: Positive for nausea and vomiting.  Genitourinary: Negative.   Musculoskeletal: Positive for neck pain.  Skin: Negative.   Neurological: Positive for weakness and headaches. Negative for dizziness, seizures, syncope, light-headedness and numbness.  Psychiatric/Behavioral: Negative for confusion.    Allergies  Review of patient's allergies indicates no known allergies.  Home Medications   Prior to Admission medications   Medication Sig Start Date End Date Taking? Authorizing Provider  HYDROcodone-acetaminophen (NORCO/VICODIN) 5-325 MG tablet Take 1-2 tablets by mouth every 6 (six) hours as needed. 01/17/15  Yes Gwyneth SproutWhitney Plunkett, MD  ALBUTEROL IN Inhale into the lungs.    Historical Provider, MD  CHLORTHALIDONE PO Take by mouth.    Historical Provider, MD  cyclobenzaprine (FLEXERIL) 10 MG tablet Take 1 tablet (10 mg total) by mouth 2 (two) times daily as needed for muscle spasms. 01/17/15   Gwyneth SproutWhitney Plunkett, MD  Loratadine (CLARITIN PO) Take by mouth.    Historical Provider, MD  potassium chloride SA (K-DUR,KLOR-CON) 20 MEQ tablet Take 40 mEq by mouth 2 (two) times daily.    Historical Provider, MD   Meds Ordered and Administered this Visit  Medications - No data to display  BP 149/90 mmHg  Pulse 68  Temp(Src) 97.7 F (36.5 C) (Oral)  Resp 16  SpO2 98%  LMP 06/18/2015 No data found.  Physical Exam  Constitutional: She is oriented to person, place, and time. She appears well-developed and well-nourished.  Cardiovascular: Normal rate.   Pulmonary/Chest: Effort normal. No respiratory distress.  Neurological: She is alert and oriented to person, place, and time.  Psychiatric: Her affect is blunt and inappropriate. Her speech is delayed. She is slowed and withdrawn. Cognition and memory are not  impaired. She expresses impulsivity and inappropriate judgment.  Nursing note and vitals reviewed.   ED Course  Procedures (including critical care time)  Labs Review Labs Reviewed - No data to display  Imaging Review No results found.   Visual Acuity Review  Right Eye Distance:   Left Eye Distance:   Bilateral Distance:    Right Eye Near:   Left Eye Near:    Bilateral Near:         MDM   1. Migraine without status migrainosus, not intractable, unspecified migraine type    The examiner remained in the room while the patient was making a decision as to what to do. I advised her that it was necessary to perform a physical/neurologic exam prior to administering medications and letting her go home. She remained silent and maintained her right lateral recumbent position. She would occasionally mumble something that I could not understand but her children apparently could. I explained that I was not refusing treatment at all but that I was concerned that since she was unable to sit on the side of the table or perform any of the task of the neurologic exam that I was concerned that some other condition may exist. I advised that I was willing to offer her medication injection similar to what she had received in the emergency department previously but again advised that it was necessary to perform the exam. She was also given the choice to go to the emergency department for additional evaluation and management. This might be the best option since she does not have the ability to ambulate, sit on the exam table or perform small task during the neuro exam. After the patient had remained silent for a while she told her children that she just wanted to go home. She continued to refuse to move or allow the exam to occur. At this point her children stated that they would take her home. Her son put her shoes on her feet and at some point with assistance she was able to get into a wheelchair and let  her children take her to the car. This was her choice.  The patient left without examination or pharmacologic treatment.   Hayden Rasmussen, NP 06/18/15 2130

## 2015-06-18 NOTE — ED Notes (Signed)
C/o constant migraine HA onset 1200 today associated w/n/v/d Reports she gets botox inj every 10 weeks A&O x4... No acute distress.

## 2016-03-24 ENCOUNTER — Emergency Department (HOSPITAL_BASED_OUTPATIENT_CLINIC_OR_DEPARTMENT_OTHER)
Admission: EM | Admit: 2016-03-24 | Discharge: 2016-03-24 | Disposition: A | Discharge status: Home / Self Care | Payer: BLUE CROSS/BLUE SHIELD | Attending: Emergency Medicine | Admitting: Emergency Medicine

## 2016-03-24 ENCOUNTER — Encounter (HOSPITAL_BASED_OUTPATIENT_CLINIC_OR_DEPARTMENT_OTHER): Payer: Self-pay | Admitting: *Deleted

## 2016-03-24 ENCOUNTER — Emergency Department (HOSPITAL_BASED_OUTPATIENT_CLINIC_OR_DEPARTMENT_OTHER): Payer: BLUE CROSS/BLUE SHIELD

## 2016-03-24 DIAGNOSIS — M25562 Pain in left knee: Secondary | ICD-10-CM | POA: Insufficient documentation

## 2016-03-24 DIAGNOSIS — J45909 Unspecified asthma, uncomplicated: Secondary | ICD-10-CM | POA: Diagnosis not present

## 2016-03-24 DIAGNOSIS — M25569 Pain in unspecified knee: Secondary | ICD-10-CM

## 2016-03-24 DIAGNOSIS — G8929 Other chronic pain: Secondary | ICD-10-CM | POA: Insufficient documentation

## 2016-03-24 MED ORDER — TRAMADOL HCL 50 MG PO TABS: 50.0000 mg | ORAL_TABLET | Freq: Four times a day (QID) | ORAL | 0 refills | Status: DC | PRN

## 2016-03-24 NOTE — ED Provider Notes (Signed)
[facility] Provider Note   CSN: [id number] Arrival date & time: 03/24/16  1215     History   Chief Complaint Chief Complaint  Patient presents with  . Knee Pain    HPI Levin ErpShanta Wagler is a 36 y.o. female.  HPI Levin ErpShanta Casebolt is a 36 yo female with history of HTN, asthma and migraine who presents with left knee pain. Reports knee pain for "years". She says it started hurting when she was in [employer]. She denies history of trauma or injury. She says the pain has gradually gotten worse over the last one year. Denies change over the last one week or month. She says "I am tired of waking up with pain and came to be checked". She says pain awakens her up from sleep. Pain is achy and sore. Pain is worse with standing, sitting and walking. She also reports some spasm in her calf. Also reports swelling about two weeks ago that has resolved on its own. She states using tylenol and ibuprofen for pain without improvement.  She denies fever, chills, skin redness, fatigue or other joint pain.  Past Medical History:  Diagnosis Date  . Asthma   . Migraines     There are no active problems to display for this patient.   Past Surgical History:  Procedure Laterality Date  . HERNIA REPAIR    . HERNIA REPAIR    . TUBAL LIGATION      OB History    No data available       Home Medications    Prior to Admission medications   Medication Sig Start Date End Date Taking? Authorizing Provider  traMADol (ULTRAM) 50 MG tablet Take 1 tablet (50 mg total) by mouth every 6 (six) hours as needed. 03/24/16   Almon Herculesaye T Gonfa, MD    Family History No family history on file.  Social History Social History  Substance Use Topics  . Smoking status: Never Smoker  . Smokeless tobacco: Never Used  . Alcohol use No     Allergies   Patient has no known allergies.   Review of Systems Review of Systems  Constitutional: Negative.  Negative for appetite change, fever and unexpected weight  change.  HENT: Negative for dental problem, hearing loss, sore throat and trouble swallowing.   Eyes: Negative for visual disturbance.  Respiratory: Negative for cough, chest tightness, shortness of breath and wheezing.   Cardiovascular: Negative for chest pain, palpitations and leg swelling.  Gastrointestinal: Negative for abdominal pain, blood in stool and constipation.  Endocrine: Negative for cold intolerance and heat intolerance.  Genitourinary: Negative for dyspareunia, dysuria, genital sores, hematuria and vaginal bleeding.  Musculoskeletal: Positive for arthralgias.       Left knee pain  Skin: Negative for rash.  Neurological: Negative for weakness, numbness and headaches.  Hematological: Negative for adenopathy. Does not bruise/bleed easily.  Psychiatric/Behavioral: Negative for dysphoric mood and sleep disturbance. The patient is not nervous/anxious.    Physical Exam Updated Vital Signs BP 155/99 (BP Location: Right Arm)   Pulse 71   Temp 98.2 F (36.8 C) (Oral)   Resp 20   Ht 5\' 2"  (1.575 m)   Wt 84.8 kg   LMP 03/18/2016   SpO2 100%   BMI 34.20 kg/m   Physical Exam GEN: appears well, no apparent distress. HEM: negative for cervical or periauricular lymphadenopathies CVS: RRR, nl s1 & s2, no murmurs, no edema,  2+ DP & PT pulses bilaterally, cap refills < 2 secs RESP:  no IWOB, CTAB GI: BS present & normal, soft, NTND MSK:   Left knee No apparent deformities or skin lesion. Appears symmetric compared to right. No increased warmth. Limited range of motion due to pain, mainly over the medials aspect of her left knee. Tender to palpation, mainly over MCL. No baker's cyst. Pain with valgus and varus stresses, but more with valgus. No effusion, bulge/balloon sign. Walked to radiology room for x-ray although with antalgic gait SKIN: no apparent skin lesion  NEURO: alert and oiented appropriately, no gross defecits  PSYCH: euthymic mood with congruent affect   ED  Treatments / Results  Labs (all labs ordered are listed, but only abnormal results are displayed) Labs Reviewed - No data to display  EKG  EKG Interpretation None       Radiology Dg Knee Complete 4 Views Left  Result Date: 03/24/2016 CLINICAL DATA:  Chronic left knee pain for 8 years. No known injury. EXAM: LEFT KNEE - COMPLETE 4+ VIEW COMPARISON:  08/28/2012 FINDINGS: No evidence of fracture, dislocation, or joint effusion. No evidence of arthropathy or other focal bone abnormality. Soft tissues are unremarkable. IMPRESSION: Negative. Electronically Signed   By: Elige Ko   On: 03/24/2016 16:27    Procedures Procedures (including critical care time)  Medications Ordered in ED Medications - No data to display   Initial Impression / Assessment and Plan / ED Course  I have reviewed the triage vital signs and the nursing notes.  Pertinent labs & imaging results that were available during my care of the patient were reviewed by me and considered in my medical decision making (see chart for details).  Left knee pain concerning for MCL injury. She also has crepitus when examined by Dr. Dalene Seltzer which is concerning for OA or meniscal tear. DG-knee negative for osseous or soft tissue abnormality. I am wondering if her tenderness is out of proportion. She didn't let me flex or extend her knee but was able to walk to radiology although with antalgic gait. Low suspicion for septic joint without constitutional symptoms.  Gave prescription for tramadol 50 mg #10. Recommended follow up with sport medicine. Gave her phone numbers.  [place] narcotic data base reviewed and no red flag.  Final Clinical Impressions(s) / ED Diagnoses   Final diagnoses:  Chronic pain of left knee    New Prescriptions Discharge Medication List as of 03/24/2016  4:48 PM    START taking these medications   Details  traMADol (ULTRAM) 50 MG tablet Take 1 tablet (50 mg total) by mouth every 6 (six) hours as needed.,  Starting Sat 03/24/2016, Print         Almon Hercules, MD 03/24/16 1610    Alvira Monday, MD 03/28/16 [phone]

## 2016-03-24 NOTE — Discharge Instructions (Addendum)
It is nice taking care of you today! Your knee pain is likely due to osteoarthritis or soft tissue injury Your X-ray did not show fracture or injury We gave you  prescription for tramadol to help with the pain. We also recommend trying ice or heat.  We recommend follow-up with sports medicine doctor. Please call and make an appointment as soon as possible.

## 2016-03-24 NOTE — ED Triage Notes (Signed)
Left knee pain x years.  Worsening over the last 8-9 months.  Ambulatory with steady gain and slight limp.

## 2016-03-27 ENCOUNTER — Encounter (HOSPITAL_BASED_OUTPATIENT_CLINIC_OR_DEPARTMENT_OTHER): Payer: Self-pay | Admitting: *Deleted

## 2019-02-25 ENCOUNTER — Emergency Department (HOSPITAL_BASED_OUTPATIENT_CLINIC_OR_DEPARTMENT_OTHER): Payer: No Typology Code available for payment source

## 2019-02-25 ENCOUNTER — Encounter (HOSPITAL_BASED_OUTPATIENT_CLINIC_OR_DEPARTMENT_OTHER): Payer: Self-pay | Admitting: *Deleted

## 2019-02-25 ENCOUNTER — Emergency Department (HOSPITAL_BASED_OUTPATIENT_CLINIC_OR_DEPARTMENT_OTHER)
Admission: EM | Admit: 2019-02-25 | Discharge: 2019-02-25 | Disposition: A | Discharge status: Home / Self Care | Payer: No Typology Code available for payment source | Attending: Emergency Medicine | Admitting: Emergency Medicine

## 2019-02-25 ENCOUNTER — Other Ambulatory Visit: Payer: Self-pay

## 2019-02-25 DIAGNOSIS — I10 Essential (primary) hypertension: Secondary | ICD-10-CM | POA: Diagnosis not present

## 2019-02-25 DIAGNOSIS — R0602 Shortness of breath: Secondary | ICD-10-CM | POA: Diagnosis not present

## 2019-02-25 DIAGNOSIS — J90 Pleural effusion, not elsewhere classified: Secondary | ICD-10-CM | POA: Diagnosis not present

## 2019-02-25 DIAGNOSIS — G43909 Migraine, unspecified, not intractable, without status migrainosus: Secondary | ICD-10-CM | POA: Insufficient documentation

## 2019-02-25 DIAGNOSIS — R0789 Other chest pain: Secondary | ICD-10-CM | POA: Diagnosis present

## 2019-02-25 DIAGNOSIS — J45909 Unspecified asthma, uncomplicated: Secondary | ICD-10-CM | POA: Insufficient documentation

## 2019-02-25 DIAGNOSIS — Z79899 Other long term (current) drug therapy: Secondary | ICD-10-CM | POA: Diagnosis not present

## 2019-02-25 DIAGNOSIS — R079 Chest pain, unspecified: Secondary | ICD-10-CM

## 2019-02-25 LAB — CBC WITH DIFFERENTIAL/PLATELET
Abs Immature Granulocytes: 0.02 10*3/uL (ref 0.00–0.07)
Basophils Absolute: 0.1 10*3/uL (ref 0.0–0.1)
Basophils Relative: 1 %
Eosinophils Absolute: 0.1 10*3/uL (ref 0.0–0.5)
Eosinophils Relative: 2 %
HCT: 46.3 % — ABNORMAL HIGH (ref 36.0–46.0)
Hemoglobin: 14.8 g/dL (ref 12.0–15.0)
Immature Granulocytes: 0 %
Lymphocytes Relative: 43 %
Lymphs Abs: 3.9 10*3/uL (ref 0.7–4.0)
MCH: 27 pg (ref 26.0–34.0)
MCHC: 32 g/dL (ref 30.0–36.0)
MCV: 84.5 fL (ref 80.0–100.0)
Monocytes Absolute: 0.6 10*3/uL (ref 0.1–1.0)
Monocytes Relative: 7 %
Neutro Abs: 4.4 10*3/uL (ref 1.7–7.7)
Neutrophils Relative %: 47 %
Platelets: 340 10*3/uL (ref 150–400)
RBC: 5.48 MIL/uL — ABNORMAL HIGH (ref 3.87–5.11)
RDW: 13.2 % (ref 11.5–15.5)
WBC: 9.1 10*3/uL (ref 4.0–10.5)
nRBC: 0 % (ref 0.0–0.2)

## 2019-02-25 LAB — COMPREHENSIVE METABOLIC PANEL
ALT: 41 U/L (ref 0–44)
AST: 30 U/L (ref 15–41)
Albumin: 4.1 g/dL (ref 3.5–5.0)
Alkaline Phosphatase: 97 U/L (ref 38–126)
Anion gap: 9 (ref 5–15)
BUN: 8 mg/dL (ref 6–20)
CO2: 24 mmol/L (ref 22–32)
Calcium: 9.4 mg/dL (ref 8.9–10.3)
Chloride: 104 mmol/L (ref 98–111)
Creatinine, Ser: 0.59 mg/dL (ref 0.44–1.00)
GFR calc Af Amer: >60 mL/min (ref >60)
GFR calc non Af Amer: >60 mL/min (ref >60)
Glucose, Bld: 103 mg/dL — ABNORMAL HIGH (ref 70–99)
Potassium: 3.3 mmol/L — ABNORMAL LOW (ref 3.5–5.1)
Sodium: 137 mmol/L (ref 135–145)
Total Bilirubin: 0.5 mg/dL (ref 0.3–1.2)
Total Protein: 7.4 g/dL (ref 6.5–8.1)

## 2019-02-25 LAB — URINALYSIS, ROUTINE W REFLEX MICROSCOPIC
Bilirubin Urine: NEGATIVE
Glucose, UA: NEGATIVE mg/dL
Hgb urine dipstick: NEGATIVE
Ketones, ur: NEGATIVE mg/dL
Leukocytes,Ua: NEGATIVE
Nitrite: NEGATIVE
Protein, ur: NEGATIVE mg/dL
Specific Gravity, Urine: 1.01 (ref 1.005–1.030)
pH: 7 (ref 5.0–8.0)

## 2019-02-25 LAB — PREGNANCY, URINE: Preg Test, Ur: NEGATIVE

## 2019-02-25 LAB — BRAIN NATRIURETIC PEPTIDE: B Natriuretic Peptide: 56.2 pg/mL (ref 0.0–100.0)

## 2019-02-25 LAB — D-DIMER, QUANTITATIVE: D-Dimer, Quant: 0.62 ug/mL-FEU — ABNORMAL HIGH (ref 0.00–0.50)

## 2019-02-25 LAB — TROPONIN I (HIGH SENSITIVITY)
Troponin I (High Sensitivity): <2 ng/L (ref <18)
Troponin I (High Sensitivity): <2 ng/L (ref <18)

## 2019-02-25 MED ORDER — KETOROLAC TROMETHAMINE 30 MG/ML IJ SOLN: 30.0000 mg | Freq: Once | INTRAMUSCULAR | Status: DC

## 2019-02-25 MED ORDER — IOHEXOL 350 MG/ML SOLN
100.0000 mL | Freq: Once | INTRAVENOUS | Status: AC
  Administered 2019-02-25: 100 mL via INTRAVENOUS

## 2019-02-25 MED ORDER — ONDANSETRON HCL 4 MG/2ML IJ SOLN
4.0000 mg | Freq: Once | INTRAMUSCULAR | Status: AC
  Administered 2019-02-25: 4 mg via INTRAVENOUS
  Filled 2019-02-25: qty 2

## 2019-02-25 MED ORDER — METOCLOPRAMIDE HCL 5 MG/ML IJ SOLN
10.0000 mg | Freq: Once | INTRAMUSCULAR | Status: AC
  Administered 2019-02-25: 10 mg via INTRAVENOUS
  Filled 2019-02-25: qty 2

## 2019-02-25 MED ORDER — DEXAMETHASONE SODIUM PHOSPHATE 10 MG/ML IJ SOLN
10.0000 mg | Freq: Once | INTRAMUSCULAR | Status: AC
  Administered 2019-02-25: 21:00:00 10 mg via INTRAVENOUS
  Filled 2019-02-25: qty 1

## 2019-02-25 MED ORDER — MORPHINE SULFATE (PF) 2 MG/ML IV SOLN
2.0000 mg | Freq: Once | INTRAVENOUS | Status: AC
  Administered 2019-02-25: 2 mg via INTRAVENOUS
  Filled 2019-02-25: qty 1

## 2019-02-25 MED ORDER — DIPHENHYDRAMINE HCL 50 MG/ML IJ SOLN
25.0000 mg | Freq: Once | INTRAMUSCULAR | Status: AC
  Administered 2019-02-25: 17:00:00 25 mg via INTRAVENOUS
  Filled 2019-02-25: qty 1

## 2019-02-25 NOTE — ED Provider Notes (Signed)
[facility] EMERGENCY DEPARTMENT Provider Note   CSN: [id number] Arrival date & time: 02/25/19  1522     History Chief Complaint  Patient presents with   Headache    Ashley Bell is a 39 y.o. female past medical history of asthma, migraines who presents today for evaluation of multiple complaints.  Patient states that she is concerned about her blood pressure which she states has been elevated the last week.  She states she has measured it in the 150s-160s.  Patient also reports that about a week ago, she developed a mild headache that feels similar to her migraines.  She has associated photophobia but no vision changes.  She states that the headache is mostly right-sided and feels like it goes into her neck.  She has tried taking her medications at home with no improvement in her headache.  She states this feels typical to her past history of migraines.  She reports associated photophobia.  Patient also reports that over the last 3 days, she developed a chest tightness/heaviness but also feels like a pulling in pushing sensation.  She states it is mostly in the middle and left side of her chest.  She initially thought this was related to her asthma and tried taking her albuterol but did not have any improvement.  She states that this has been constant over the last 3 days.  She does report is worse with deep inspiration also notes it is worse when she walks.  No associated diaphoresis or nausea/vomiting.  She does state that she has had some shortness of breath over the last 3 days also.  She noted that when she walked from her car to the triage room here in the emergency department, she felt severely short of breath.  She has not noted any cough, congestion, fevers but has states she felt hot at home.  She has had lightheadedness that feels like she is going to pass out.  No room spinning or dizziness sensation.  She had one episode of abdominal pain yesterday that resolved on its own.  No  associated nausea/vomiting.  She does not smoke.  She does have history of hypertension and states that she was recently diagnosed with diabetes but has not been started on the medication.  No personal cardiac history or family history of MI before the age of 19.  She is not a smoker and denies any illicit drug use. She denies any OCP use, recent immobilization, prior history of DVT/PE, recent surgery, leg swelling, or long travel.  She denies any difficulty walking, numbness/weakness of arms or legs.  The history is provided by the patient.    HPI: A 39 year old patient with a history of hypertension presents for evaluation of chest pain. Initial onset of pain was more than 6 hours ago. The patient's chest pain is described as heaviness/pressure/tightness and is not worse with exertion. The patient's chest pain is middle- or left-sided, is not well-localized, is not sharp and does not radiate to the arms/jaw/neck. The patient does not complain of nausea and denies diaphoresis. The patient has no history of stroke, has no history of peripheral artery disease, has not smoked in the past 90 days, denies any history of treated diabetes, has no relevant family history of coronary artery disease (first degree relative at less than age 78), has no history of hypercholesterolemia and does not have an elevated BMI (>=30).   Past Medical History:  Diagnosis Date   Asthma    Migraines  There are no problems to display for this patient.   Past Surgical History:  Procedure Laterality Date   HERNIA REPAIR     HERNIA REPAIR     TUBAL LIGATION       OB History   No obstetric history on file.     History reviewed. No pertinent family history.  Social History   Tobacco Use   Smoking status: Never Smoker   Smokeless tobacco: Never Used  Substance Use Topics   Alcohol use: No   Drug use: No    Home Medications Prior to Admission medications   Medication Sig Start Date End Date  Taking? Authorizing Provider  traMADol (ULTRAM) 50 MG tablet Take 1 tablet (50 mg total) by mouth every 6 (six) hours as needed. 03/24/16   Almon HerculesGonfa, Taye T, MD  SUMAtriptan (IMITREX) 6 MG/0.5ML SOLN Inject 6 mg into the skin every 2 (two) hours as needed. For migraine    01/08/15  [provider]    Allergies    Other, Sumatriptan, and Lisinopril  Review of Systems   Review of Systems  Constitutional: Negative for fever.  Eyes: Positive for photophobia. Negative for visual disturbance.  Respiratory: Positive for shortness of breath. Negative for cough.   Cardiovascular: Positive for chest pain.  Gastrointestinal: Negative for abdominal pain, nausea and vomiting.  Genitourinary: Negative for dysuria and hematuria.  Neurological: Positive for light-headedness and headaches. Negative for weakness and numbness.  All other systems reviewed and are negative.   Physical Exam Updated Vital Signs BP (!) 148/88 (BP Location: Left Arm)    Pulse 72    Temp 98.4 F (36.9 C) (Oral)    Resp 20    LMP 02/22/2019    SpO2 98%   Physical Exam Vitals and nursing note reviewed.  Constitutional:      Appearance: Normal appearance. She is well-developed.     Comments: Sitting comfortably on examination table  HENT:     Head: Normocephalic and atraumatic.     Comments: PERRL. EOMs intact. No nystagmus. No neglect.  Eyes:     General: Lids are normal.     Conjunctiva/sclera: Conjunctivae normal.     Pupils: Pupils are equal, round, and reactive to light.  Neck:     Comments: Full range of motion with any difficulty.  Mild tenderness palpation noted to the posterior right paraspinal muscles of the cervical region.  No midline bony tenderness.  No deformity or crepitus noted.  Neck is supple and without rigidity. Cardiovascular:     Rate and Rhythm: Normal rate and regular rhythm.     Pulses: Normal pulses.          Radial pulses are 2+ on the right side and 2+ on the left side.       Dorsalis  pedis pulses are 2+ on the right side and 2+ on the left side.     Heart sounds: Normal heart sounds. No murmur. No friction rub. No gallop.   Pulmonary:     Effort: Pulmonary effort is normal.     Breath sounds: Normal breath sounds.     Comments: Lungs clear to auscultation bilaterally.  Symmetric chest rise.  No wheezing, rales, rhonchi. Chest:     Comments: Slight pain with palpation of the anterior chest wall.  No deformity or crepitus noted. Abdominal:     Palpations: Abdomen is soft. Abdomen is not rigid.     Tenderness: There is no abdominal tenderness. There is no guarding.  Comments: Abdomen is soft, non-distended, non-tender. No rigidity, No guarding. No peritoneal signs.  Musculoskeletal:        General: Normal range of motion.     Cervical back: Full passive range of motion without pain and neck supple.  Skin:    General: Skin is warm and dry.     Capillary Refill: Capillary refill takes less than 2 seconds.  Neurological:     Mental Status: She is alert and oriented to person, place, and time.     Comments: Cranial nerves III-XII intact Follows commands, Moves all extremities  5/5 strength to BUE and BLE  Sensation intact throughout all major nerve distributions No slurred speech. No facial droop.   Psychiatric:        Speech: Speech normal.     ED Results / Procedures / Treatments   Labs (all labs ordered are listed, but only abnormal results are displayed) Labs Reviewed  COMPREHENSIVE METABOLIC PANEL - Abnormal; Notable for the following components:      Result Value   Potassium 3.3 (*)    Glucose, Bld 103 (*)    All other components within normal limits  CBC WITH DIFFERENTIAL/PLATELET - Abnormal; Notable for the following components:   RBC 5.48 (*)    HCT 46.3 (*)    All other components within normal limits  D-DIMER, QUANTITATIVE (NOT AT [facility]) - Abnormal; Notable for the following components:   D-Dimer, Quant 0.62 (*)    All other components within  normal limits  PREGNANCY, URINE  URINALYSIS, ROUTINE W REFLEX MICROSCOPIC  BRAIN NATRIURETIC PEPTIDE  TROPONIN I (HIGH SENSITIVITY)  TROPONIN I (HIGH SENSITIVITY)    EKG EKG Interpretation  Date/Time:  Wednesday February 25 2019 16:18:24 EST Ventricular Rate:  69 PR Interval:    QRS Duration: 93 QT Interval:  406 QTC Calculation: 435 R Axis:   61 Text Interpretation: Sinus rhythm Low voltage, precordial leads Borderline T abnormalities, diffuse leads No STEMI Confirmed by Alvester Chou [phone]) on 02/25/2019 4:56:08 PM   Radiology DG Chest 2 View  Result Date: 02/25/2019 CLINICAL DATA:  Chest pain.  Hypertension. EXAM: CHEST - 2 VIEW COMPARISON:  01/27/2015 FINDINGS: Borderline cardiomegaly, new. Pulmonary vascularity is normal. Both lungs are clear. The visualized skeletal structures are unremarkable. IMPRESSION: New borderline cardiomegaly.  Otherwise normal exam. Electronically Signed   By: Francene Boyers M.D.   On: 02/25/2019 17:09   CT Angio Chest PE W and/or Wo Contrast  Result Date: 02/25/2019 CLINICAL DATA:  Shortness of breath. EXAM: CT ANGIOGRAPHY CHEST WITH CONTRAST TECHNIQUE: Multidetector CT imaging of the chest was performed using the standard protocol during bolus administration of intravenous contrast. Multiplanar CT image reconstructions and MIPs were obtained to evaluate the vascular anatomy. CONTRAST:  OMNIPAQUE IOHEXOL 350 MG/ML SOLN COMPARISON:  Chest x-ray from same day. CT chest report dated August 28, 2012. FINDINGS: Cardiovascular: Satisfactory opacification of the pulmonary arteries to the segmental level. No evidence of pulmonary embolism. Mild cardiomegaly. No pericardial effusion. No thoracic aortic aneurysm or dissection. Mediastinum/Nodes: No enlarged mediastinal, hilar, or axillary lymph nodes. Thyroid gland, trachea, and esophagus demonstrate no significant findings. Lungs/Pleura: Trace bilateral pleural effusions. No consolidation or pneumothorax. No  suspicious pulmonary nodule. Upper Abdomen: No acute abnormality. Musculoskeletal: No chest wall abnormality. No acute or significant osseous findings. Review of the MIP images confirms the above findings. IMPRESSION: 1.  No evidence of pulmonary embolism. 2. Mild cardiomegaly with trace bilateral pleural effusions. Electronically Signed   By: Vickki Hearing.D.  On: 02/25/2019 18:05    Procedures Procedures (including critical care time)  Medications Ordered in ED Medications  ondansetron (ZOFRAN) injection 4 mg (4 mg Intravenous Given 02/25/19 1640)  metoCLOPramide (REGLAN) injection 10 mg (10 mg Intravenous Given 02/25/19 1643)  diphenhydrAMINE (BENADRYL) injection 25 mg (25 mg Intravenous Given 02/25/19 1642)  morphine 2 MG/ML injection 2 mg (2 mg Intravenous Given 02/25/19 1645)  iohexol (OMNIPAQUE) 350 MG/ML injection 100 mL (100 mLs Intravenous Contrast Given 02/25/19 1748)  dexamethasone (DECADRON) injection 10 mg (10 mg Intravenous Given 02/25/19 2051)    ED Course  I have reviewed the triage vital signs and the nursing notes.  Pertinent labs & imaging results that were available during my care of the patient were reviewed by me and considered in my medical decision making (see chart for details).    MDM Rules/Calculators/A&P HEAR Score: 42                    39 year old female who presents for evaluation of multiple complaints today.  Reports concern of blood pressure over the last week as well as migraine headache that has been ongoing for the last week.  Also reports 3 days of chest pain, shortness of breath, lightheadedness sensation.  She is currently on amlodipine 5 mg which she states she has been compliant with. Patient is afebrile, non-toxic appearing, sitting comfortably on examination table. Vital signs reviewed and stable.  Concern for migraine that is causing her hypertension.  Doubt hypertensive emergency.  Low suspicion for ACS etiology but also consideration.  He does  not have any risk factors for PE but she does report pleuritic chest pain and feel like she is short of breath.  She is low risk but will obtain D-dimer.  History/physical exam is not concerning for CVA, intracranial hemorrhage, dural venous sinus thrombosis.  Plan to check labs, EKG, chest x-ray.  Will give migraine cocktail.  Initial troponin is negative.  CMP shows potassium of 3.3.  Normal BUN and creatinine.  CBC shows no leukocytosis.  Hemoglobin stable at 14.8.  D-dimer is slightly elevated at 0.62.  Chest x-ray shows no infectious etiology but does show a slight cardiomegaly.  Given elevated D-dimer, will plan for CT of chest.  CTA of chest shows no evidence of PE.  She has mild cardiomegaly with trace bilateral pleural effusions.  BNP is negative.  Given patient's history/risk factors, she has a heart score of 3.  Her delta troponin is negative.  I discussed results with patient.  She reports chest pain is improved significantly since being here in the emergency department as well as shortness of breath.  She states headache has significantly proved that she still has some lingering headache.  We will add on additional Toradol to her migraine cocktail.  At this time, patient is hemodynamically stable.  Her blood pressure 132/94.  I did discuss with patient regarding keeping a log of her blood pressures and having follow-up with her primary care doctor.  I discussed with her that she may be on too low dose of medication and she may need adjustment.  I discussed with her that given lack of follow-up here in the emergency department, any adjustment in her medications would be best done by her primary care doctor.  Patient is in agreement. At this time, patient exhibits no emergent life-threatening condition that require further evaluation in ED or admission. Patient had ample opportunity for questions and discussion. All patient's questions were answered with full understanding.  Strict return  precautions discussed. Patient expresses understanding and agreement to plan.   Portions of this note were generated with Scientist, clinical (histocompatibility and immunogenetics). Dictation errors may occur despite best attempts at proofreading.  Final Clinical Impression(s) / ED Diagnoses Final diagnoses:  Chest pain, unspecified type  Pleural effusion  Migraine without status migrainosus, not intractable, unspecified migraine type  Essential hypertension    Rx / DC Orders ED Discharge Orders    None       Rosana Hoes 02/25/19 2233    Terald Sleeper, MD 02/26/19 1124

## 2019-02-25 NOTE — Discharge Instructions (Addendum)
As we discussed, your work-up today was reassuring.  Her CT did not show any evidence of blood clots in your lungs but did show small amount of fluid in your lungs, noticed pleural effusions.  This needs to be followed up by your primary care doctor.  Additionally, as we discussed, you need to log a report of your blood pressure readings that you can follow-up with your primary care doctor.  Your medications may need to be adjusted.  Return to the emergency department for any worsening pain, difficulty breathing, numbness/weakness of your arms or legs or any other worsening or concerning symptoms.

## 2019-02-25 NOTE — ED Notes (Signed)
Pt unable to urinate at this time.  

## 2019-02-25 NOTE — ED Triage Notes (Signed)
Pt c/o h/a and increased BP x 1 week

## 2019-06-27 ENCOUNTER — Encounter (HOSPITAL_BASED_OUTPATIENT_CLINIC_OR_DEPARTMENT_OTHER): Payer: Self-pay | Admitting: Emergency Medicine

## 2019-06-27 ENCOUNTER — Other Ambulatory Visit: Payer: Self-pay

## 2019-06-27 ENCOUNTER — Emergency Department (HOSPITAL_BASED_OUTPATIENT_CLINIC_OR_DEPARTMENT_OTHER)
Admission: EM | Admit: 2019-06-27 | Discharge: 2019-06-27 | Disposition: A | Discharge status: Home / Self Care | Payer: No Typology Code available for payment source | Attending: Emergency Medicine | Admitting: Emergency Medicine

## 2019-06-27 DIAGNOSIS — J45909 Unspecified asthma, uncomplicated: Secondary | ICD-10-CM | POA: Insufficient documentation

## 2019-06-27 DIAGNOSIS — B029 Zoster without complications: Secondary | ICD-10-CM

## 2019-06-27 DIAGNOSIS — Z888 Allergy status to other drugs, medicaments and biological substances status: Secondary | ICD-10-CM | POA: Insufficient documentation

## 2019-06-27 DIAGNOSIS — R0789 Other chest pain: Secondary | ICD-10-CM | POA: Diagnosis present

## 2019-06-27 MED ORDER — OXYCODONE-ACETAMINOPHEN 10-325 MG PO TABS: 1.0000 | ORAL_TABLET | Freq: Four times a day (QID) | ORAL | 0 refills | Status: DC | PRN

## 2019-06-27 MED ORDER — FENTANYL CITRATE (PF) 100 MCG/2ML IJ SOLN
100.0000 ug | Freq: Once | INTRAMUSCULAR | Status: AC
  Administered 2019-06-27: 100 ug via INTRAVENOUS
  Filled 2019-06-27: qty 2

## 2019-06-27 MED ORDER — VALACYCLOVIR HCL 500 MG PO TABS
1000.0000 mg | ORAL_TABLET | Freq: Once | ORAL | Status: AC
  Administered 2019-06-27: 1000 mg via ORAL
  Filled 2019-06-27: qty 2

## 2019-06-27 MED ORDER — ONDANSETRON HCL 4 MG/2ML IJ SOLN
4.0000 mg | Freq: Once | INTRAMUSCULAR | Status: AC
  Administered 2019-06-27: 4 mg via INTRAVENOUS
  Filled 2019-06-27: qty 2

## 2019-06-27 MED ORDER — VALACYCLOVIR HCL 1 G PO TABS: 1000.0000 mg | ORAL_TABLET | Freq: Three times a day (TID) | ORAL | 0 refills | Status: AC

## 2019-06-27 NOTE — ED Provider Notes (Signed)
[facility] Provider Note: Lowella Dell, MD, FACEP  CSN: [id number] MRN: [id number] ARRIVAL: 06/27/19 at 0433 ROOM: [facility]05/[facility]05   CHIEF COMPLAINT  Chest Pain and Shoulder Pain   HISTORY OF PRESENT ILLNESS  06/27/19 4:59 AM Ashley Bell is a 39 y.o. female who awoke at 230 this morning with severe pain in her chest radiating around to her back.  It is in a dermatomal pattern passing under her left breast.  There is no associated rash yet.  There is associated hyperesthesia to even light touch.  She rates her pain as an 8 out of 10.  She has difficulty characterizing the pain and hyperesthesia but uses terms such as sharp and pressure-like.  It is worse also with movement or deep breathing although she is not short of breath.   Past Medical History:  Diagnosis Date  . Asthma   . Migraines     Past Surgical History:  Procedure Laterality Date  . HERNIA REPAIR    . HERNIA REPAIR    . TUBAL LIGATION      History reviewed. No pertinent family history.  Social History   Tobacco Use  . Smoking status: Never Smoker  . Smokeless tobacco: Never Used  Substance Use Topics  . Alcohol use: No  . Drug use: No    Prior to Admission medications   Medication Sig Start Date End Date Taking? Authorizing Provider  oxyCODONE-acetaminophen (PERCOCET) 10-325 MG tablet Take 1 tablet by mouth every 6 (six) hours as needed for pain (may cause constipation). 06/27/19   Montanna Mcbain, MD  valACYclovir (VALTREX) 1000 MG tablet Take 1 tablet (1,000 mg total) by mouth 3 (three) times daily. 06/27/19   Ara Grandmaison, MD  SUMAtriptan (IMITREX) 6 MG/0.5ML SOLN Inject 6 mg into the skin every 2 (two) hours as needed. For migraine    01/08/15  [provider]    Allergies Other, Sumatriptan, and Lisinopril   REVIEW OF SYSTEMS  Negative except as noted here or in the History of Present Illness.   PHYSICAL EXAMINATION  Initial Vital Signs Blood pressure (!) 172/106, pulse 79,  temperature 98 F (36.7 C), temperature source Oral, resp. rate 18, height 5\' 2"  (1.575 m), weight 93 kg, SpO2 99 %.  Examination General: Well-developed, well-nourished female in no acute distress; appearance consistent with age of record HENT: normocephalic; atraumatic Eyes: pupils equal, round and reactive to light; extraocular muscles intact Neck: supple Heart: regular rate and rhythm Lungs: clear to auscultation bilaterally Chest: Severe tenderness and hyperesthesia of the chest from the lower sternum around to the left subscapular region in a dermatomal pattern that does not involve her upper extremity, no associated rash seen Abdomen: soft; nondistended; nontender; no masses or hepatosplenomegaly; bowel sounds present Extremities: No deformity; full range of motion; pulses normal Neurologic: Awake, alert and oriented; motor function intact in all extremities and symmetric; no facial droop Skin: Warm and dry Psychiatric: Flat affect   RESULTS  Summary of this visit's results, reviewed and interpreted by myself:   EKG Interpretation  Date/Time:  Saturday Jun 27 2019 04:54:30 EDT Ventricular Rate:  79 PR Interval:    QRS Duration: 88 QT Interval:  382 QTC Calculation: 438 R Axis:   51 Text Interpretation: Sinus rhythm Low voltage, precordial leads Borderline T abnormalities, diffuse leads No significant change was found Confirmed by [birth year] (Paula Libra) on 06/27/2019 4:59:41 AM      Laboratory Studies: No results found for this or any previous visit (from the  past 24 hour(s)). Imaging Studies: No results found.  ED COURSE and MDM  Nursing notes, initial and subsequent vitals signs, including pulse oximetry, reviewed and interpreted by myself.  Vitals:   06/27/19 0444  BP: (!) 172/106  Pulse: 79  Resp: 18  Temp: 98 F (36.7 C)  TempSrc: Oral  SpO2: 99%  Weight: 93 kg  Height: 5\' 2"  (1.575 m)   Medications  valACYclovir (VALTREX) tablet 1,000 mg (has no  administration in time range)  ondansetron (ZOFRAN) injection 4 mg (4 mg Intravenous Given 06/27/19 0516)  fentaNYL (SUBLIMAZE) injection 100 mcg (100 mcg Intravenous Given 06/27/19 0516)    The patient's presentation is consistent with early shingles.  The pain and hyperesthesia are in a dermatomal pattern and no rashes yet present.  Patient was advised she will likely develop a rash and she will be potentially contagious for chickenpox to those who have not had the disease or the vaccine.  She is not contagious for shingles itself.  She is a [place] patient and will follow up with the [facility] for subsequent care but we will initiate antiviral and analgesic treatment.  PROCEDURES  Procedures   ED DIAGNOSES     ICD-10-CM   1. Herpes zoster without complication  P29.5        Audrey Thull, MD 06/27/19 [id number]

## 2019-06-27 NOTE — ED Triage Notes (Signed)
  Patient comes in with chest pains that started around 0230 this morning.  Patient states the pain woke her up and was in the middle of her chest radiating to her L shoulder.  No nausea or vomiting.  No diaphoresis.  Hurts more when taking a deep breath.  Pain 8/10.  Hx HTN.

## 2019-07-12 ENCOUNTER — Emergency Department (HOSPITAL_BASED_OUTPATIENT_CLINIC_OR_DEPARTMENT_OTHER)
Admission: EM | Admit: 2019-07-12 | Discharge: 2019-07-13 | Disposition: A | Discharge status: Home / Self Care | Payer: No Typology Code available for payment source | Attending: Emergency Medicine | Admitting: Emergency Medicine

## 2019-07-12 ENCOUNTER — Other Ambulatory Visit: Payer: Self-pay

## 2019-07-12 ENCOUNTER — Encounter (HOSPITAL_BASED_OUTPATIENT_CLINIC_OR_DEPARTMENT_OTHER): Payer: Self-pay | Admitting: Emergency Medicine

## 2019-07-12 DIAGNOSIS — I10 Essential (primary) hypertension: Secondary | ICD-10-CM | POA: Diagnosis not present

## 2019-07-12 DIAGNOSIS — R1013 Epigastric pain: Secondary | ICD-10-CM | POA: Insufficient documentation

## 2019-07-12 DIAGNOSIS — R0789 Other chest pain: Secondary | ICD-10-CM | POA: Insufficient documentation

## 2019-07-12 DIAGNOSIS — R1110 Vomiting, unspecified: Secondary | ICD-10-CM | POA: Insufficient documentation

## 2019-07-12 DIAGNOSIS — J45909 Unspecified asthma, uncomplicated: Secondary | ICD-10-CM | POA: Diagnosis not present

## 2019-07-12 DIAGNOSIS — K8020 Calculus of gallbladder without cholecystitis without obstruction: Secondary | ICD-10-CM | POA: Diagnosis not present

## 2019-07-12 DIAGNOSIS — R748 Abnormal levels of other serum enzymes: Secondary | ICD-10-CM | POA: Insufficient documentation

## 2019-07-12 DIAGNOSIS — R7989 Other specified abnormal findings of blood chemistry: Secondary | ICD-10-CM

## 2019-07-12 NOTE — ED Triage Notes (Signed)
Patient reports continuing to have chest pain in the center of her chest.  Also endorses nausea and feeling dizzy.  Ambulatory at triage.

## 2019-07-13 ENCOUNTER — Emergency Department (HOSPITAL_COMMUNITY)
Admission: EM | Admit: 2019-07-13 | Discharge: 2019-07-14 | Disposition: A | Discharge status: Home / Self Care | Payer: Medicaid Other | Attending: Emergency Medicine | Admitting: Emergency Medicine

## 2019-07-13 ENCOUNTER — Emergency Department (HOSPITAL_BASED_OUTPATIENT_CLINIC_OR_DEPARTMENT_OTHER): Payer: No Typology Code available for payment source

## 2019-07-13 ENCOUNTER — Emergency Department (HOSPITAL_BASED_OUTPATIENT_CLINIC_OR_DEPARTMENT_OTHER)
Admit: 2019-07-13 | Discharge: 2019-07-13 | Disposition: A | Discharge status: Home / Self Care | Payer: No Typology Code available for payment source | Attending: Emergency Medicine | Admitting: Emergency Medicine

## 2019-07-13 ENCOUNTER — Other Ambulatory Visit: Payer: Self-pay

## 2019-07-13 ENCOUNTER — Emergency Department (HOSPITAL_COMMUNITY): Payer: Medicaid Other

## 2019-07-13 DIAGNOSIS — R109 Unspecified abdominal pain: Secondary | ICD-10-CM

## 2019-07-13 DIAGNOSIS — K8020 Calculus of gallbladder without cholecystitis without obstruction: Secondary | ICD-10-CM | POA: Insufficient documentation

## 2019-07-13 DIAGNOSIS — R1010 Upper abdominal pain, unspecified: Secondary | ICD-10-CM | POA: Diagnosis present

## 2019-07-13 DIAGNOSIS — J45909 Unspecified asthma, uncomplicated: Secondary | ICD-10-CM | POA: Diagnosis not present

## 2019-07-13 LAB — D-DIMER, QUANTITATIVE: D-Dimer, Quant: 0.46 ug/mL-FEU (ref 0.00–0.50)

## 2019-07-13 LAB — CBC WITH DIFFERENTIAL/PLATELET
Abs Immature Granulocytes: 0.03 10*3/uL (ref 0.00–0.07)
Basophils Absolute: 0.1 10*3/uL (ref 0.0–0.1)
Basophils Relative: 1 %
Eosinophils Absolute: 0.1 10*3/uL (ref 0.0–0.5)
Eosinophils Relative: 1 %
HCT: 39.7 % (ref 36.0–46.0)
Hemoglobin: 13.2 g/dL (ref 12.0–15.0)
Immature Granulocytes: 0 %
Lymphocytes Relative: 24 %
Lymphs Abs: 2.8 10*3/uL (ref 0.7–4.0)
MCH: 27.3 pg (ref 26.0–34.0)
MCHC: 33.2 g/dL (ref 30.0–36.0)
MCV: 82.2 fL (ref 80.0–100.0)
Monocytes Absolute: 0.7 10*3/uL (ref 0.1–1.0)
Monocytes Relative: 6 %
Neutro Abs: 8 10*3/uL — ABNORMAL HIGH (ref 1.7–7.7)
Neutrophils Relative %: 68 %
Platelets: 350 10*3/uL (ref 150–400)
RBC: 4.83 MIL/uL (ref 3.87–5.11)
RDW: 13 % (ref 11.5–15.5)
WBC: 11.8 10*3/uL — ABNORMAL HIGH (ref 4.0–10.5)
nRBC: 0 % (ref 0.0–0.2)

## 2019-07-13 LAB — COMPREHENSIVE METABOLIC PANEL
ALT: 51 U/L — ABNORMAL HIGH (ref 0–44)
AST: 106 U/L — ABNORMAL HIGH (ref 15–41)
Albumin: 3.7 g/dL (ref 3.5–5.0)
Alkaline Phosphatase: 98 U/L (ref 38–126)
Anion gap: 11 (ref 5–15)
BUN: 13 mg/dL (ref 6–20)
CO2: 26 mmol/L (ref 22–32)
Calcium: 9.1 mg/dL (ref 8.9–10.3)
Chloride: 102 mmol/L (ref 98–111)
Creatinine, Ser: 0.65 mg/dL (ref 0.44–1.00)
GFR calc Af Amer: >60 mL/min (ref >60)
GFR calc non Af Amer: >60 mL/min (ref >60)
Glucose, Bld: 127 mg/dL — ABNORMAL HIGH (ref 70–99)
Potassium: 3.3 mmol/L — ABNORMAL LOW (ref 3.5–5.1)
Sodium: 139 mmol/L (ref 135–145)
Total Bilirubin: 0.5 mg/dL (ref 0.3–1.2)
Total Protein: 6.7 g/dL (ref 6.5–8.1)

## 2019-07-13 LAB — TROPONIN I (HIGH SENSITIVITY)
Troponin I (High Sensitivity): 4 ng/L (ref <18)
Troponin I (High Sensitivity): 4 ng/L (ref <18)

## 2019-07-13 LAB — LIPASE, BLOOD: Lipase: 27 U/L (ref 11–51)

## 2019-07-13 MED ORDER — IOHEXOL 300 MG/ML  SOLN
100.0000 mL | Freq: Once | INTRAMUSCULAR | Status: AC | PRN
  Administered 2019-07-13: 100 mL via INTRAVENOUS

## 2019-07-13 MED ORDER — SODIUM CHLORIDE (PF) 0.9 % IJ SOLN
INTRAMUSCULAR | Status: AC
  Filled 2019-07-13: qty 50

## 2019-07-13 MED ORDER — KETOROLAC TROMETHAMINE 15 MG/ML IJ SOLN
15.0000 mg | Freq: Once | INTRAMUSCULAR | Status: AC
  Administered 2019-07-13: 15 mg via INTRAVENOUS
  Filled 2019-07-13: qty 1

## 2019-07-13 MED ORDER — HYDROCODONE-ACETAMINOPHEN 5-325 MG PO TABS: 1.0000 | ORAL_TABLET | ORAL | 0 refills | Status: DC | PRN

## 2019-07-13 MED ORDER — ONDANSETRON 4 MG PO TBDP: 4.0000 mg | ORAL_TABLET | Freq: Three times a day (TID) | ORAL | 0 refills | Status: AC | PRN

## 2019-07-13 NOTE — ED Triage Notes (Signed)
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

## 2019-07-13 NOTE — ED Notes (Signed)
COVID swab collection delayed due to pt refusing. Pt stating she needs to be given medication for her migraine or something to eat before the swab.

## 2019-07-13 NOTE — ED Provider Notes (Signed)
39 year old presents with imaging studies concerning for acute cholecystitis at [facility]. Surgery was consulted. Dr. Cliffton Asters has seen patient and he has recommended a CT abdomen/pelvis because he is not convinced this is cholecystitis. She had a recent ex-lap and ventral hernia repair at the [facility] 3 months ago and has had chronic abdominal issues since then. Will obtain CT A/P.   CT is negative. Discussed with Dr. Cliffton Asters again. He is recommending pain control and outpatient f/u with the [place].     Bethel Born, PA-C 07/15/19 3893    Geoffery Lyons, MD 07/16/19 [phone]

## 2019-07-13 NOTE — ED Provider Notes (Signed)
[facility] EMERGENCY DEPARTMENT Provider Note   CSN: [id number] Arrival date & time: 07/12/19  2334     History Chief Complaint  Patient presents with  . Chest Pain    Ashley Bell is a 39 y.o. female.  HPI     This is a 39 year old female with a history of asthma and hypertension who presents with chest pain.  Patient reports acute onset of anterior chest pain radiating to her left shoulder and bilateral arms as well as back just prior to arrival.  She had similar pain approximately 2 weeks ago and states that she was diagnosed with presumptive shingles.  She was unable to get her medications filled.  She has not developed a rash.  She reports the pain is worse with certain movements and with deep breathing.  Denies any shortness of breath, fevers, cough.  She also reports that she occasionally has some epigastric discomfort and burning.  Worse with eating.  Pain is mostly in the epigastrium and right upper quadrant.  She has had several episodes of nonbilious emesis.  No diarrhea.  Currently she rates pain 8 out of 10.  She has not taken anything for pain.  Past Medical History:  Diagnosis Date  . Asthma   . Migraines     There are no problems to display for this patient.   Past Surgical History:  Procedure Laterality Date  . HERNIA REPAIR    . HERNIA REPAIR    . TUBAL LIGATION       OB History   No obstetric history on file.     No family history on file.  Social History   Tobacco Use  . Smoking status: Never Smoker  . Smokeless tobacco: Never Used  Substance Use Topics  . Alcohol use: No  . Drug use: No    Home Medications Prior to Admission medications   Medication Sig Start Date End Date Taking? Authorizing Provider  oxyCODONE-acetaminophen (PERCOCET) 10-325 MG tablet Take 1 tablet by mouth every 6 (six) hours as needed for pain (may cause constipation). 06/27/19   Molpus, John, MD  valACYclovir (VALTREX) 1000 MG tablet Take 1 tablet (1,000  mg total) by mouth 3 (three) times daily. 06/27/19   Molpus, John, MD  SUMAtriptan (IMITREX) 6 MG/0.5ML SOLN Inject 6 mg into the skin every 2 (two) hours as needed. For migraine    01/08/15  [provider]    Allergies    Other, Sumatriptan, and Lisinopril  Review of Systems   Review of Systems  Constitutional: Negative for fever.  Respiratory: Negative for shortness of breath.   Cardiovascular: Positive for chest pain. Negative for leg swelling.  Gastrointestinal: Positive for abdominal pain and vomiting.  Genitourinary: Negative for dysuria.  Neurological: Positive for dizziness.  All other systems reviewed and are negative.   Physical Exam Updated Vital Signs BP (!) 155/105   Pulse 88   Temp 99.6 F (37.6 C) (Oral)   Resp (!) 28   Ht 1.6 m (5\' 3" )   Wt 93 kg   LMP 07/03/2019   SpO2 98%   BMI 36.31 kg/m   Physical Exam Vitals and nursing note reviewed.  Constitutional:      Appearance: She is well-developed. She is obese. She is not ill-appearing.  HENT:     Head: Normocephalic and atraumatic.  Eyes:     Pupils: Pupils are equal, round, and reactive to light.  Cardiovascular:     Rate and Rhythm: Normal rate and regular  rhythm.     Heart sounds: Normal heart sounds.  Pulmonary:     Effort: Pulmonary effort is normal. No respiratory distress.     Breath sounds: No wheezing.  Chest:     Chest wall: Tenderness present.  Abdominal:     General: Bowel sounds are normal.     Palpations: Abdomen is soft.     Tenderness: There is abdominal tenderness. There is no guarding or rebound.     Comments: Epigastric and right upper quadrant tenderness to palpation, no rebound or guarding  Musculoskeletal:     Cervical back: Neck supple.     Right lower leg: No tenderness. No edema.     Left lower leg: No tenderness. No edema.  Skin:    General: Skin is warm and dry.     Findings: No rash.  Neurological:     Mental Status: She is alert and oriented to person,  place, and time.  Psychiatric:        Mood and Affect: Mood normal.     ED Results / Procedures / Treatments   Labs (all labs ordered are listed, but only abnormal results are displayed) Labs Reviewed  CBC WITH DIFFERENTIAL/PLATELET - Abnormal; Notable for the following components:      Result Value   WBC 11.8 (*)    Neutro Abs 8.0 (*)    All other components within normal limits  COMPREHENSIVE METABOLIC PANEL - Abnormal; Notable for the following components:   Potassium 3.3 (*)    Glucose, Bld 127 (*)    AST 106 (*)    ALT 51 (*)    All other components within normal limits  D-DIMER, QUANTITATIVE (NOT AT [facility])  LIPASE, BLOOD  TROPONIN I (HIGH SENSITIVITY)  TROPONIN I (HIGH SENSITIVITY)    EKG EKG Interpretation  Date/Time:  Sunday Jul 12 2019 23:45:40 EDT Ventricular Rate:  91 PR Interval:    QRS Duration: 92 QT Interval:  379 QTC Calculation: 467 R Axis:   66 Text Interpretation: Sinus rhythm Low voltage, precordial leads Borderline T abnormalities, diffuse leads Confirmed by Ross Marcus ([id number]) on 07/13/2019 12:52:05 AM   Radiology DG Chest 2 View  Result Date: 07/13/2019 CLINICAL DATA:  Chest pain EXAM: CHEST - 2 VIEW COMPARISON:  02/25/2019 FINDINGS: The heart size and mediastinal contours are within normal limits. Both lungs are clear. The visualized skeletal structures are unremarkable. IMPRESSION: No active cardiopulmonary disease. Electronically Signed   By: Deatra Robinson M.D.   On: 07/13/2019 00:45    Procedures Procedures (including critical care time)  Medications Ordered in ED Medications  ketorolac (TORADOL) 15 MG/ML injection 15 mg (15 mg Intravenous Given 07/13/19 0018)    ED Course  I have reviewed the triage vital signs and the nursing notes.  Pertinent labs & imaging results that were available during my care of the patient were reviewed by me and considered in my medical decision making (see chart for details).    MDM  Rules/Calculators/A&P                       Patient presents with chest discomfort.  Also reports left foot nominal discomfort and vomiting.  She is overall nontoxic.  Vital signs notable for elevated blood pressure.  She has reproducible chest wall and epigastric pain without signs of peritonitis or overlying skin changes.  No evidence of rash.  Doubt shingles.  EKG obtained without acute ischemic changes.  Chest x-ray shows no evidence of pneumothorax  or pneumonia.  Lab work obtained.  Initial troponin negative.  D-dimer is negative effectively ruling out blood clots.  She does have a slight elevation in her AST and ALT which is new.  Lipase is normal.  Doubt pancreatitis.  Patient denies any alcohol use or significant NSAIDs use.  Doubt pancreatitis or gastritis.  Cholecystitis is a consideration given increased pain with eating.  Difficult to assess whether these symptoms are separate or related.  Regarding her chest pain, work-up is negative including negative D-dimer and troponin x2.  She is fairly low risk.  On recheck, she feels somewhat better with Toradol.  Discussed her work-up.  Will obtain outpatient right upper quadrant ultrasound imaging.  Patient is agreeable to plan.  After history, exam, and medical workup I feel the patient has been appropriately medically screened and is safe for discharge home. Pertinent diagnoses were discussed with the patient. Patient was given return precautions.   Final Clinical Impression(s) / ED Diagnoses Final diagnoses:  Atypical chest pain  Elevated LFTs    Rx / DC Orders ED Discharge Orders         Ordered    US Abdomen Limited RUQ/Gall Bladder     07/13/19 0243           Merryl Hacker, MD 07/13/19 [phone]

## 2019-07-13 NOTE — ED Provider Notes (Signed)
Patient seen last night by Dr. Wilkie Aye.  Had a right upper quadrant ultrasound that was performed today.  Imaging was concerning for acute cholecystitis.  She did have mild LFT elevation as well as a white blood cell count of 11.8.  I discussed this with Dr. Cliffton Asters, general surgery he recommended that I send her ED to ED and he would see her at [facility] long in the emergency department and he estimates that she would likely need to have her gallbladder removed.  Will send to the [facility] ED. I discussed case with Dr. Pilar Plate who accepts patient in  transfer.   Melene Plan, DO 07/13/19 [phone]

## 2019-07-13 NOTE — Discharge Instructions (Addendum)
Your seen today for chest pain.  Your pain is atypical in nature.  Your work-up is largely reassuring.  Your heart tests were negative.  You did have a slight elevation in your LFTs and given that some of the pain is epigastric in nature, this could be related to your gallbladder.  Ultrasound was ordered.  Return later today for further evaluation.

## 2019-07-13 NOTE — H&P (Signed)
CC: Abdominal discomfort; gallstones  Requesting provider: Dr. Adela Lank  HPI: Ashley Bell is an 39 y.o. female with hx of asthma, migraines whom presented to [facility] ED with complaints of vague upper abdominal discomforts overnight. She was asked to return for ultrasound today as there were issues obtaining this overnight.  Currently, she reports all of her symptoms related to hunger pains.  She reports having severe headaches related not being able to eat in the last couple of hours due to recommendations from [facility].  She reports her pains as being across her upper abdomen both left and right sides.  They are dull.  She denies fever/chills/nausea/vomiting.  They have been improved today relative to overnight.  Of note, [facility].  She reports having had prior surgery on her belly for bowel resection as well as subsequent hernia repair  WBC 11.8 LFTs show mild trans-aminitis but normal bilirubin Lipase normal  [place] RUQ showed gallstones without wall thickening but possible cholesterolosis vs adenomyomatosis. CBD 5 mm.   Past Medical History:  Diagnosis Date  . Asthma   . Migraines     Past Surgical History:  Procedure Laterality Date  . HERNIA REPAIR    . HERNIA REPAIR    . TUBAL LIGATION      No family history on file.  Social:  reports that she has never smoked. She has never used smokeless tobacco. She reports that she does not drink alcohol or use drugs.  Allergies:  Allergies  Allergen Reactions  . Other Anaphylaxis  . Sumatriptan Other (See Comments)    Other reaction(s): Chest Pain Chest pain   . Lisinopril     Other reaction(s): Cough (ALLERGY/intolerance)    Medications: I have reviewed the patient's current medications.  Results for orders placed or performed during the hospital encounter of 07/12/19 (from the past 48 hour(s))  CBC with Differential     Status:  Abnormal   Collection Time: 07/13/19 12:16 AM  Result Value Ref Range   WBC 11.8 (H) 4.0 - 10.5 K/uL   RBC 4.83 3.87 - 5.11 MIL/uL   Hemoglobin 13.2 12.0 - 15.0 g/dL   HCT 62.1 30.8 - 65.7 %   MCV 82.2 80.0 - 100.0 fL   MCH 27.3 26.0 - 34.0 pg   MCHC 33.2 30.0 - 36.0 g/dL   RDW 84.6 96.2 - 95.2 %   Platelets 350 150 - 400 K/uL   nRBC 0.0 0.0 - 0.2 %   Neutrophils Relative % 68 %   Neutro Abs 8.0 (H) 1.7 - 7.7 K/uL   Lymphocytes Relative 24 %   Lymphs Abs 2.8 0.7 - 4.0 K/uL   Monocytes Relative 6 %   Monocytes Absolute 0.7 0.1 - 1.0 K/uL   Eosinophils Relative 1 %   Eosinophils Absolute 0.1 0.0 - 0.5 K/uL   Basophils Relative 1 %   Basophils Absolute 0.1 0.0 - 0.1 K/uL   Immature Granulocytes 0 %   Abs Immature Granulocytes 0.03 0.00 - 0.07 K/uL    Comment: Performed at [facility], [address] [place], [place] [address]  Troponin I (High Sensitivity)     Status: None   Collection Time: 07/13/19 12:16 AM  Result Value Ref Range   Troponin I (High Sensitivity) 4 <18 ng/L    Comment: (NOTE) Elevated high sensitivity troponin I (hsTnI) values and significant  changes across serial measurements may  suggest ACS but many other  chronic and acute conditions are known to elevate hsTnI results.  Refer to the "Links" section for chest pain algorithms and additional  guidance. Performed at [facility], [address] [place], [place] [address]   D-dimer, quantitative (not at [facility])     Status: None   Collection Time: 07/13/19 12:16 AM  Result Value Ref Range   D-Dimer, Quant 0.46 0.00 - 0.50 ug/mL-FEU    Comment: (NOTE) At the manufacturer cut-off of 0.50 ug/mL FEU, this assay has been documented to exclude PE with a sensitivity and negative predictive value of 97 to 99%.  At this time, this assay has not been approved by the FDA to exclude DVT/VTE. Results should be correlated with clinical presentation. Performed at [facility], [address] [place], [place] [address]   Comprehensive metabolic panel     Status: Abnormal   Collection Time: 07/13/19 12:16 AM  Result Value Ref Range   Sodium 139 135 - 145 mmol/L   Potassium 3.3 (L) 3.5 - 5.1 mmol/L   Chloride 102 98 - 111 mmol/L   CO2 26 22 - 32 mmol/L   Glucose, Bld 127 (H) 70 - 99 mg/dL    Comment: Glucose reference range applies only to samples taken after fasting for at least 8 hours.   BUN 13 6 - 20 mg/dL   Creatinine, Ser 0.65 0.44 - 1.00 mg/dL   Calcium 9.1 8.9 - 10.3 mg/dL   Total Protein 6.7 6.5 - 8.1 g/dL   Albumin 3.7 3.5 - 5.0 g/dL   AST 106 (H) 15 - 41 U/L   ALT 51 (H) 0 - 44 U/L   Alkaline Phosphatase 98 38 - 126 U/L   Total Bilirubin 0.5 0.3 - 1.2 mg/dL   GFR calc non Af Amer >60 >60 mL/min   GFR calc Af Amer >60 >60 mL/min   Anion gap 11 5 - 15    Comment: Performed at [facility], [address] [place], [place] [address]  Lipase, blood     Status: None   Collection Time: 07/13/19 12:16 AM  Result Value Ref Range   Lipase 27 11 - 51 U/L    Comment: Performed at [facility], [address] [place], [place] [address]  Troponin I (High Sensitivity)     Status: None   Collection Time: 07/13/19  2:05 AM  Result Value Ref Range   Troponin I (High Sensitivity) 4 <18 ng/L    Comment: (NOTE) Elevated high sensitivity troponin I (hsTnI) values and significant  changes across serial measurements may suggest ACS but many other  chronic and acute conditions are known to elevate hsTnI results.  Refer to the "Links" section for chest pain algorithms and additional  guidance. Performed at [facility], [address] [place], [place] [address]     DG Chest 2 View  Result Date: 07/13/2019 CLINICAL DATA:  Chest pain EXAM: CHEST - 2 VIEW COMPARISON:  02/25/2019 FINDINGS: The heart size and mediastinal contours are within normal limits. Both lungs are clear. The visualized skeletal structures are unremarkable.  IMPRESSION: No active cardiopulmonary disease. Electronically Signed   By: Ulyses Jarred M.D.   On: 07/13/2019 00:45   CT ABDOMEN PELVIS W CONTRAST  Result Date: 07/13/2019 CLINICAL DATA:  39 year old female with nausea and vomiting. EXAM: CT ABDOMEN AND PELVIS WITH CONTRAST TECHNIQUE: Multidetector CT imaging of the abdomen  and pelvis was performed using the standard protocol following bolus administration of intravenous contrast. CONTRAST:  100mL OMNIPAQUE IOHEXOL 300 MG/ML  SOLN COMPARISON:  None. FINDINGS: Lower chest: There are trace bilateral pleural effusions of indeterminate etiology. Clinical correlation is recommended. The visualized lung bases are otherwise clear. No intra-abdominal free air. Small free fluid within the pelvis. Hepatobiliary: The liver is unremarkable. The gallbladder is contracted. Pancreas: Unremarkable. No pancreatic ductal dilatation or surrounding inflammatory changes. Spleen: Normal in size without focal abnormality. Adrenals/Urinary Tract: The adrenal glands are unremarkable. The kidneys, visualized ureters, and urinary bladder appear unremarkable. Stomach/Bowel: There is no bowel obstruction or active inflammation. The appendix is normal. Vascular/Lymphatic: The abdominal aorta and IVC are unremarkable. No portal venous gas. There is no adenopathy. Reproductive: The uterus is anteverted. Anterior body fibroid measures 3.6 cm but suboptimally visualized and poorly evaluated. There is a right tubal ligation clip. The left tubal ligation clip is no longer in position and has migrated to the right upper abdomen. Other: Small fat containing umbilical hernia. Midline vertical anterior abdominal wall incisional scar. Ventral hernia repair. Musculoskeletal: No acute or significant osseous findings. IMPRESSION: 1. No bowel obstruction. Normal appendix. 2. Trace bilateral pleural effusions of indeterminate etiology. Clinical correlation is recommended. 3. Migrated left tubal ligation  clip to the right upper abdomen. Electronically Signed   By: Elgie CollardArash  Radparvar M.D.   On: 07/13/2019 23:10   US Abdomen Limited RUQ/Gall Bladder  Result Date: 07/13/2019 CLINICAL DATA:  Elevated liver enzymes EXAM: ULTRASOUND ABDOMEN LIMITED RIGHT UPPER QUADRANT COMPARISON:  None. FINDINGS: Gallbladder: Within the gallbladder, there are echogenic foci which move and shadow consistent with cholelithiasis. Largest gallstone measures 8 mm in length. There is mild ring down type artifact suggesting underlying inflammation of the gallbladder wall such as adenomyomatosis or cholesterolosis. The gallbladder wall is not appreciably thickened. There is no biliary duct dilatation. Note that the patient is focally tender over the gallbladder. Common bile duct: Diameter: 5 mm. No intrahepatic or extrahepatic biliary duct dilatation. Liver: No focal lesion identified. Within normal limits in parenchymal echogenicity. Portal vein is patent on color Doppler imaging with normal direction of blood flow towards the liver. Other: None. IMPRESSION: Cholelithiasis. Suspect a degree of underlying inflammation of the gallbladder wall such as adenomyomatosis or cholesterolosis. Patient is focally tender over the gallbladder. These findings raise concern for potential acute cholecystitis. In this regard, it may be prudent to correlate with nuclear medicine hepatobiliary imaging study to assess for cystic duct patency. Study otherwise unremarkable. These results will be called to the ordering clinician or representative by the Radiologist Assistant, and communication documented in the PACS or [employer]. Electronically Signed   By: Bretta BangWilliam  Woodruff III M.D.   On: 07/13/2019 15:17    ROS - all of the below systems have been reviewed with the patient and positives are indicated with bold text General: chills, fever or night sweats Eyes: blurry vision or double vision ENT: epistaxis or sore throat Allergy/Immunology:  itchy/watery eyes or nasal congestion Hematologic/Lymphatic: bleeding problems, blood clots or swollen lymph nodes Endocrine: temperature intolerance or unexpected weight changes Breast: new or changing breast lumps or nipple discharge Resp: cough, shortness of breath, or wheezing CV: chest pain or dyspnea on exertion GI: as per HPI GU: dysuria, trouble voiding, or hematuria MSK: joint pain or joint stiffness Neuro: TIA or stroke symptoms Derm: pruritus and skin lesion changes Psych: anxiety and depression  PE Blood pressure (!) 187/114, pulse 80, temperature 98.3 F (36.8 C),  resp. rate 18, height 5\' 3"  (1.6 m), weight 93 kg, last menstrual period 07/03/2019, SpO2 99 %. Constitutional: NAD; conversant; no deformities Eyes: Moist conjunctiva; no lid lag; anicteric; pupils equal and round Neck: Trachea midline; no thyromegaly Lungs: Normal respiratory effort; no tactile fremitus CV: RRR; no palpable thrills; no pitting edema GI: Abd soft, not significantly tender nor distended; no palpable hepatosplenomegaly. Negative Murphy's sign MSK: Normal range of motion of extremities; no clubbing/cyanosis Psychiatric: Appropriate affect; alert and oriented x3 Lymphatic: No palpable cervical or axillary lymphadenopathy  Results for orders placed or performed during the hospital encounter of 07/12/19 (from the past 48 hour(s))  CBC with Differential     Status: Abnormal   Collection Time: 07/13/19 12:16 AM  Result Value Ref Range   WBC 11.8 (H) 4.0 - 10.5 K/uL   RBC 4.83 3.87 - 5.11 MIL/uL   Hemoglobin 13.2 12.0 - 15.0 g/dL   HCT 07/15/19 95.6 - 38.7 %   MCV 82.2 80.0 - 100.0 fL   MCH 27.3 26.0 - 34.0 pg   MCHC 33.2 30.0 - 36.0 g/dL   RDW 56.4 33.2 - 95.1 %   Platelets 350 150 - 400 K/uL   nRBC 0.0 0.0 - 0.2 %   Neutrophils Relative % 68 %   Neutro Abs 8.0 (H) 1.7 - 7.7 K/uL   Lymphocytes Relative 24 %   Lymphs Abs 2.8 0.7 - 4.0 K/uL   Monocytes Relative 6 %   Monocytes Absolute 0.7 0.1 -  1.0 K/uL   Eosinophils Relative 1 %   Eosinophils Absolute 0.1 0.0 - 0.5 K/uL   Basophils Relative 1 %   Basophils Absolute 0.1 0.0 - 0.1 K/uL   Immature Granulocytes 0 %   Abs Immature Granulocytes 0.03 0.00 - 0.07 K/uL    Comment: Performed at [facility], [address] [place], [place]  Troponin I (High Sensitivity)     Status: None   Collection Time: 07/13/19 12:16 AM  Result Value Ref Range   Troponin I (High Sensitivity) 4 <18 ng/L    Comment: (NOTE) Elevated high sensitivity troponin I (hsTnI) values and significant  changes across serial measurements may suggest ACS but many other  chronic and acute conditions are known to elevate hsTnI results.  Refer to the "Links" section for chest pain algorithms and additional  guidance. Performed at [facility], [address] [place], [place]   D-dimer, quantitative (not at [facility])     Status: None   Collection Time: 07/13/19 12:16 AM  Result Value Ref Range   D-Dimer, Quant 0.46 0.00 - 0.50 ug/mL-FEU    Comment: (NOTE) At the manufacturer cut-off of 0.50 ug/mL FEU, this assay has been documented to exclude PE with a sensitivity and negative predictive value of 97 to 99%.  At this time, this assay has not been approved by the FDA to exclude DVT/VTE. Results should be correlated with clinical presentation. Performed at [facility], [address] [place], [place]   Comprehensive metabolic panel     Status: Abnormal   Collection Time: 07/13/19 12:16 AM  Result Value Ref Range   Sodium 139 135 - 145 mmol/L   Potassium 3.3 (L) 3.5 - 5.1 mmol/L   Chloride 102 98 - 111 mmol/L   CO2 26 22 - 32 mmol/L   Glucose, Bld 127 (H) 70 - 99 mg/dL    Comment: Glucose reference range applies only to samples  taken after fasting for at least 8 hours.   BUN 13 6 - 20 mg/dL   Creatinine, Ser 4.98 0.44 - 1.00 mg/dL   Calcium 9.1 8.9 - 26.4 mg/dL   Total Protein 6.7 6.5 - 8.1 g/dL    Albumin 3.7 3.5 - 5.0 g/dL   AST 158 (H) 15 - 41 U/L   ALT 51 (H) 0 - 44 U/L   Alkaline Phosphatase 98 38 - 126 U/L   Total Bilirubin 0.5 0.3 - 1.2 mg/dL   GFR calc non Af Amer >60 >60 mL/min   GFR calc Af Amer >60 >60 mL/min   Anion gap 11 5 - 15    Comment: Performed at [facility], [address] [place], [place] [address]  Lipase, blood     Status: None   Collection Time: 07/13/19 12:16 AM  Result Value Ref Range   Lipase 27 11 - 51 U/L    Comment: Performed at [facility], [address] [place], [place] [address]  Troponin I (High Sensitivity)     Status: None   Collection Time: 07/13/19  2:05 AM  Result Value Ref Range   Troponin I (High Sensitivity) 4 <18 ng/L    Comment: (NOTE) Elevated high sensitivity troponin I (hsTnI) values and significant  changes across serial measurements may suggest ACS but many other  chronic and acute conditions are known to elevate hsTnI results.  Refer to the "Links" section for chest pain algorithms and additional  guidance. Performed at [facility], [address] [place], [place] [address]     DG Chest 2 View  Result Date: 07/13/2019 CLINICAL DATA:  Chest pain EXAM: CHEST - 2 VIEW COMPARISON:  02/25/2019 FINDINGS: The heart size and mediastinal contours are within normal limits. Both lungs are clear. The visualized skeletal structures are unremarkable. IMPRESSION: No active cardiopulmonary disease. Electronically Signed   By: Deatra Robinson M.D.   On: 07/13/2019 00:45   CT ABDOMEN PELVIS W CONTRAST  Result Date: 07/13/2019 CLINICAL DATA:  39 year old female with nausea and vomiting. EXAM: CT ABDOMEN AND PELVIS WITH CONTRAST TECHNIQUE: Multidetector CT imaging of the abdomen and pelvis was performed using the standard protocol following bolus administration of intravenous contrast. CONTRAST:  OMNIPAQUE IOHEXOL 300 MG/ML  SOLN COMPARISON:  None. FINDINGS: Lower chest: There are trace bilateral  pleural effusions of indeterminate etiology. Clinical correlation is recommended. The visualized lung bases are otherwise clear. No intra-abdominal free air. Small free fluid within the pelvis. Hepatobiliary: The liver is unremarkable. The gallbladder is contracted. Pancreas: Unremarkable. No pancreatic ductal dilatation or surrounding inflammatory changes. Spleen: Normal in size without focal abnormality. Adrenals/Urinary Tract: The adrenal glands are unremarkable. The kidneys, visualized ureters, and urinary bladder appear unremarkable. Stomach/Bowel: There is no bowel obstruction or active inflammation. The appendix is normal. Vascular/Lymphatic: The abdominal aorta and IVC are unremarkable. No portal venous gas. There is no adenopathy. Reproductive: The uterus is anteverted. Anterior body fibroid measures 3.6 cm but suboptimally visualized and poorly evaluated. There is a right tubal ligation clip. The left tubal ligation clip is no longer in position and has migrated to the right upper abdomen. Other: Small fat containing umbilical hernia. Midline vertical anterior abdominal wall incisional scar. Ventral hernia repair. Musculoskeletal: No acute or significant osseous findings. IMPRESSION: 1. No bowel obstruction. Normal appendix. 2. Trace bilateral pleural effusions of indeterminate etiology. Clinical correlation is recommended. 3. Migrated left tubal ligation clip to the right upper  abdomen. Electronically Signed   By: Elgie Collard M.D.   On: 07/13/2019 23:10   US Abdomen Limited RUQ/Gall Bladder  Result Date: 07/13/2019 CLINICAL DATA:  Elevated liver enzymes EXAM: ULTRASOUND ABDOMEN LIMITED RIGHT UPPER QUADRANT COMPARISON:  None. FINDINGS: Gallbladder: Within the gallbladder, there are echogenic foci which move and shadow consistent with cholelithiasis. Largest gallstone measures 8 mm in length. There is mild ring down type artifact suggesting underlying inflammation of the gallbladder wall such as  adenomyomatosis or cholesterolosis. The gallbladder wall is not appreciably thickened. There is no biliary duct dilatation. Note that the patient is focally tender over the gallbladder. Common bile duct: Diameter: 5 mm. No intrahepatic or extrahepatic biliary duct dilatation. Liver: No focal lesion identified. Within normal limits in parenchymal echogenicity. Portal vein is patent on color Doppler imaging with normal direction of blood flow towards the liver. Other: None. IMPRESSION: Cholelithiasis. Suspect a degree of underlying inflammation of the gallbladder wall such as adenomyomatosis or cholesterolosis. Patient is focally tender over the gallbladder. These findings raise concern for potential acute cholecystitis. In this regard, it may be prudent to correlate with nuclear medicine hepatobiliary imaging study to assess for cystic duct patency. Study otherwise unremarkable. These results will be called to the ordering clinician or representative by the Radiologist Assistant, and communication documented in the PACS or [employer]. Electronically Signed   By: Bretta Bang III M.D.   On: 07/13/2019 15:17   A/P: Leo Weyandt is an 39 y.o. female with gallstones on [place] but no clear findings to suggest cholecystitis on either ultrasound or CT. She has had some issues with upper abdominal pain since her hernia surgery  -Would trial pain control and expectant management - avoiding vigorous abdominal wall exercises/activities until improved as musculoskeletal component may be contributing -ER warnings for worsening symptoms/failure to resolve, fever/chills, vomiting  Stephanie Coup. Cliffton Asters, M.D. [facility]

## 2019-07-13 NOTE — Discharge Instructions (Signed)
Please make an appointment to follow up with your surgeon at the [facility] Take Norco as needed for abdominal pain Take Zofran as needed for nausea/vomiting

## 2021-05-16 ENCOUNTER — Other Ambulatory Visit: Payer: Self-pay

## 2021-05-16 DIAGNOSIS — M791 Myalgia, unspecified site: Secondary | ICD-10-CM | POA: Diagnosis present

## 2021-05-16 DIAGNOSIS — Z79899 Other long term (current) drug therapy: Secondary | ICD-10-CM | POA: Diagnosis not present

## 2021-05-16 DIAGNOSIS — Z7951 Long term (current) use of inhaled steroids: Secondary | ICD-10-CM | POA: Insufficient documentation

## 2021-05-16 DIAGNOSIS — B349 Viral infection, unspecified: Secondary | ICD-10-CM | POA: Diagnosis not present

## 2021-05-16 DIAGNOSIS — Z20822 Contact with and (suspected) exposure to covid-19: Secondary | ICD-10-CM | POA: Diagnosis not present

## 2021-05-17 ENCOUNTER — Emergency Department (HOSPITAL_BASED_OUTPATIENT_CLINIC_OR_DEPARTMENT_OTHER)
Admission: EM | Admit: 2021-05-17 | Discharge: 2021-05-17 | Disposition: A | Discharge status: Home / Self Care | Payer: No Typology Code available for payment source | Attending: Emergency Medicine | Admitting: Emergency Medicine

## 2021-05-17 ENCOUNTER — Encounter (HOSPITAL_BASED_OUTPATIENT_CLINIC_OR_DEPARTMENT_OTHER): Payer: Self-pay

## 2021-05-17 DIAGNOSIS — B349 Viral infection, unspecified: Secondary | ICD-10-CM

## 2021-05-17 LAB — RESP PANEL BY RT-PCR (FLU A&B, COVID) ARPGX2
Influenza A by PCR: NEGATIVE
Influenza B by PCR: NEGATIVE
SARS Coronavirus 2 by RT PCR: NEGATIVE

## 2021-05-17 MED ORDER — ACETAMINOPHEN 500 MG PO TABS
1000.0000 mg | ORAL_TABLET | Freq: Once | ORAL | Status: AC
  Administered 2021-05-17: 1000 mg via ORAL
  Filled 2021-05-17: qty 2

## 2021-05-17 MED ORDER — IBUPROFEN 400 MG PO TABS: 400.0000 mg | ORAL_TABLET | Freq: Four times a day (QID) | ORAL | 0 refills | Status: DC | PRN

## 2021-05-17 MED ORDER — IBUPROFEN 400 MG PO TABS
400.0000 mg | ORAL_TABLET | Freq: Once | ORAL | Status: AC
  Administered 2021-05-17: 400 mg via ORAL
  Filled 2021-05-17: qty 1

## 2021-05-17 MED ORDER — FLUTICASONE PROPIONATE 50 MCG/ACT NA SUSP: 2.0000 | Freq: Every day | NASAL | 0 refills | Status: AC

## 2021-05-17 NOTE — ED Provider Notes (Signed)
?[facility] EMERGENCY DEPARTMENT ?Provider Note ? ? ?CSN: [id number] ?Arrival date & time: 05/16/21  2356 ? ?  ? ?History ? ?Chief Complaint  ?Patient presents with  ? Generalized Body Aches  ? ? ?Ashley Bell is a 41 y.o. female. ? ?The history is provided by the patient.  ?URI ?Presenting symptoms: congestion, rhinorrhea and sore throat   ?Presenting symptoms: no fever   ?Severity:  Moderate ?Onset quality:  Gradual ?Duration:  1 day ?Timing:  Constant ?Progression:  Unchanged ?Chronicity:  New ?Relieved by:  Nothing ?Worsened by:  Nothing ?Ineffective treatments:  None tried ?Associated symptoms: myalgias, sinus pain and sneezing   ?Associated symptoms: no neck pain, no swollen glands and no wheezing   ?Risk factors: sick contacts   ?Risk factors: not elderly   ?Was in contact with nephew who is sick.   ?  ? ?Home Medications ?Prior to Admission medications   ?Medication Sig Start Date End Date Taking? Authorizing Provider  ?fluticasone (FLONASE) 50 MCG/ACT nasal spray Place 2 sprays into both nostrils daily. 05/17/21  Yes Kullen Tomasetti, MD  ?ibuprofen (ADVIL) 400 MG tablet Take 1 tablet (400 mg total) by mouth every 6 (six) hours as needed. 05/17/21  Yes Johathan Province, MD  ?albuterol (VENTOLIN HFA) 108 (90 Base) MCG/ACT inhaler Inhale 2 puffs into the lungs every 6 (six) hours as needed for wheezing or shortness of breath.    [provider]  ?amLODipine (NORVASC) 5 MG tablet Take 5 mg by mouth daily.    [provider]  ?ascorbic acid (VITAMIN C) 250 MG tablet Take 250 mg by mouth daily.    [provider]  ?busPIRone (BUSPAR) 10 MG tablet Take 10 mg by mouth 2 (two) times daily.    [provider]  ?butalbital-acetaminophen-caffeine (FIORICET) 50-325-40 MG tablet Take 2 tablets by mouth 2 (two) times daily as needed for headache.     [provider]  ?carboxymethylcellulose (REFRESH PLUS) 0.5 % SOLN Place 1 drop into both eyes 4 (four) times daily as needed  (dry eyes).    [provider]  ?cholecalciferol (VITAMIN D3) 25 MCG (1000 UNIT) tablet Take 2,000 Units by mouth daily.    [provider]  ?diclofenac Sodium (VOLTAREN) 1 % GEL Apply 2 g topically 2 (two) times daily as needed (knee pain).    [provider]  ?docusate sodium (COLACE) 100 MG capsule Take 200 mg by mouth 2 (two) times daily.    [provider]  ?ferrous sulfate 325 (65 FE) MG tablet Take 325 mg by mouth 2 (two) times daily with a meal.    [provider]  ?gabapentin (NEURONTIN) 300 MG capsule Take 300 mg by mouth 3 (three) times daily as needed (pain).    [provider]  ?HYDROcodone-acetaminophen (NORCO/VICODIN) 5-325 MG tablet Take 1 tablet by mouth every 4 (four) hours as needed. 07/13/19   Bethel Born, PA-C  ?ibuprofen (ADVIL) 800 MG tablet Take 800 mg by mouth every 8 (eight) hours as needed for fever, headache or moderate pain.    [provider]  ?ketoconazole (NIZORAL) 2 % shampoo Apply 1 application topically 3 (three) times a week. Leave on affected area for 10 minutes, then rinse off with water    [provider]  ?loratadine (CLARITIN) 10 MG tablet Take 10 mg by mouth daily.    [provider]  ?ondansetron (ZOFRAN ODT) 4 MG disintegrating tablet Take 1 tablet (4 mg total) by mouth every 8 (eight)  hours as needed for nausea or vomiting. 07/13/19   Bethel BornGekas, Kelly Marie, PA-C  ?oxyCODONE-acetaminophen (PERCOCET) 10-325 MG tablet Take 1 tablet by mouth every 6 (six) hours as needed for pain (may cause constipation). 06/27/19   Molpus, John, MD  ?rosuvastatin (CRESTOR) 40 MG tablet Take 40 mg by mouth daily.    [provider]  ?sertraline (ZOLOFT) 100 MG tablet Take 200 mg by mouth daily.    [provider]  ?valACYclovir (VALTREX) 1000 MG tablet Take 1 tablet (1,000 mg total) by mouth 3 (three) times daily. 06/27/19   Molpus, John, MD  ?SUMAtriptan (IMITREX) 6 MG/0.5ML SOLN Inject 6 mg  into the skin every 2 (two) hours as needed. For migraine ?   01/08/15  [provider]  ?   ? ?Allergies    ?Other, Sumatriptan, and Lisinopril   ? ?Review of Systems   ?Review of Systems  ?Constitutional:  Negative for fever.  ?HENT:  Positive for congestion, rhinorrhea, sinus pain, sneezing and sore throat.   ?Eyes:  Negative for redness.  ?Respiratory:  Negative for wheezing.   ?Gastrointestinal:  Negative for vomiting.  ?Musculoskeletal:  Positive for myalgias. Negative for neck pain and neck stiffness.  ?Neurological:  Negative for facial asymmetry.  ?Psychiatric/Behavioral:  Negative for agitation.   ?All other systems reviewed and are negative. ? ?Physical Exam ?Updated Vital Signs ?BP (!) 185/103   Pulse 97   Temp 99.7 ?F (37.6 ?C) (Oral)   Resp 18   Ht 5\' 1"  (1.549 m)   Wt 93.4 kg   SpO2 98%   BMI 38.92 kg/m?  ?Physical Exam ?Vitals and nursing note reviewed. Exam conducted with a chaperone present.  ?Constitutional:   ?   General: She is not in acute distress. ?   Appearance: Normal appearance.  ?HENT:  ?   Head: Normocephalic and atraumatic.  ?   Nose: Congestion present.  ?Eyes:  ?   Conjunctiva/sclera: Conjunctivae normal.  ?   Pupils: Pupils are equal, round, and reactive to light.  ?Cardiovascular:  ?   Rate and Rhythm: Normal rate and regular rhythm.  ?   Pulses: Normal pulses.  ?   Heart sounds: Normal heart sounds.  ?Pulmonary:  ?   Effort: Pulmonary effort is normal.  ?   Breath sounds: Normal breath sounds.  ?Abdominal:  ?   General: Bowel sounds are normal.  ?   Palpations: Abdomen is soft.  ?   Tenderness: There is no abdominal tenderness. There is no guarding.  ?Musculoskeletal:     ?   General: Normal range of motion.  ?   Cervical back: Normal range of motion and neck supple.  ?Skin: ?   General: Skin is warm and dry.  ?   Capillary Refill: Capillary refill takes less than 2 seconds.  ?Neurological:  ?   General: No focal deficit present.  ?   Mental Status: She is alert and  oriented to person, place, and time.  ?   Deep Tendon Reflexes: Reflexes normal.  ?Psychiatric:     ?   Mood and Affect: Mood normal.     ?   Behavior: Behavior normal.  ? ? ?ED Results / Procedures / Treatments   ?Labs ?(all labs ordered are listed, but only abnormal results are displayed) ?Labs Reviewed  ?RESP PANEL BY RT-PCR (FLU A&B, COVID) ARPGX2  ? ? ?EKG ?None ? ?Radiology ?No results found. ? ?Procedures ?Procedures  ? ? ?Medications Ordered in ED ?Medications  ?acetaminophen (  TYLENOL) tablet 1,000 mg (1,000 mg Oral Given 05/17/21 0014)  ?ibuprofen (ADVIL) tablet 400 mg (400 mg Oral Given 05/17/21 0016)  ? ? ?ED Course/ Medical Decision Making/ A&P ?  ?                        ?Medical Decision Making ?URI and body aches for one day afte being exposed to a child with same  ? ?Amount and/or Complexity of Data Reviewed ?Labs: ordered. ?   Details: covid and flu are negative as reviewed by me ? ?Risk ?OTC drugs. ?Prescription drug management. ?Risk Details: Exam and vitals are reassuring.  No signs of sepsis.  This is not a bacterial sinus infection: no fever, symptoms of 1 day or less no purulent nasal discharge.  No indication for antibiotics especially in light of exposure to child with same.  Patient with 1 day of a viral illness.  Alternate tylenol and ibuprofen for fever and pain.  Flonase for nasal congestion.  Drink lots of fluids.  Follow up with your PMD for ongoing care.   ? ? ? ?Final Clinical Impression(s) / ED Diagnoses ?Final diagnoses:  ?Viral illness  ?Return for intractable cough, coughing up blood, fevers > 100.4 unrelieved by medication, shortness of breath, intractable vomiting, chest pain, shortness of breath, weakness, numbness, changes in speech, facial asymmetry, abdominal pain, passing out, Inability to tolerate liquids or food, cough, altered mental status or any concerns. No signs of systemic illness or infection. The patient is nontoxic-appearing on exam and vital signs are within normal  limits.  ?I have reviewed the triage vital signs and the nursing notes. Pertinent labs & imaging results that were available during my care of the patient were reviewed by me and considered in my medical decis

## 2021-05-17 NOTE — ED Notes (Signed)
Patient discharged to home.  All discharge instructions reviewed.  Patient verbalized understanding via teachback method.  VS WDL.  Respirations even and unlabored.  Ambulatory out of ED.   °

## 2021-05-17 NOTE — ED Triage Notes (Signed)
Pt arrives with c/o body aches, runny nose, congestion, and sinus pressure. Per pt , she received her covid vaccine last week. Pt endorses chills.  ?

## 2021-12-28 IMAGING — DX DG CHEST 2V
2 series · 2 of 2 positions shown · non-contrast
Comparison: 02/25/2019

CLINICAL DATA: Chest pain

EXAM:
CHEST - 2 VIEW

[chest pa]
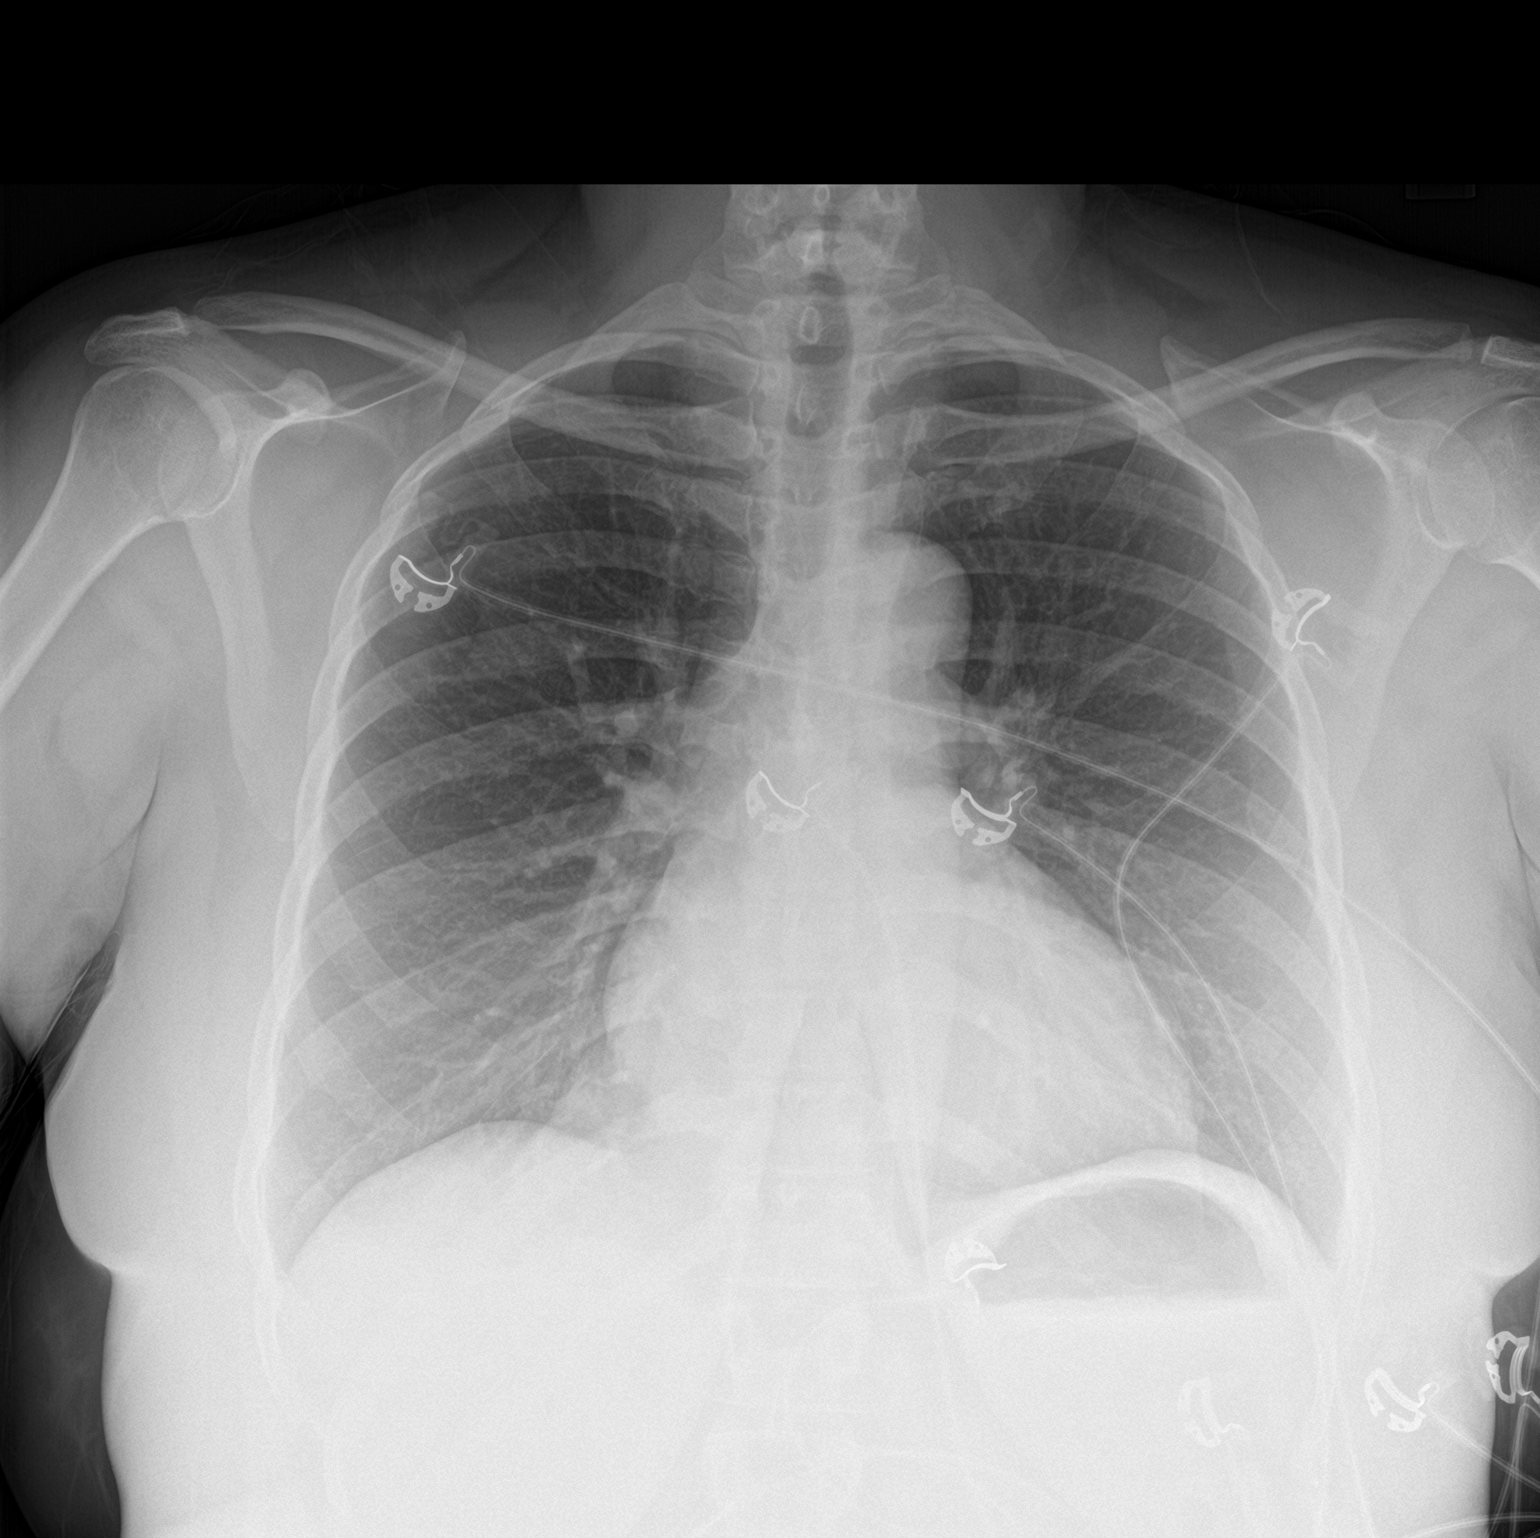

[chest lat]
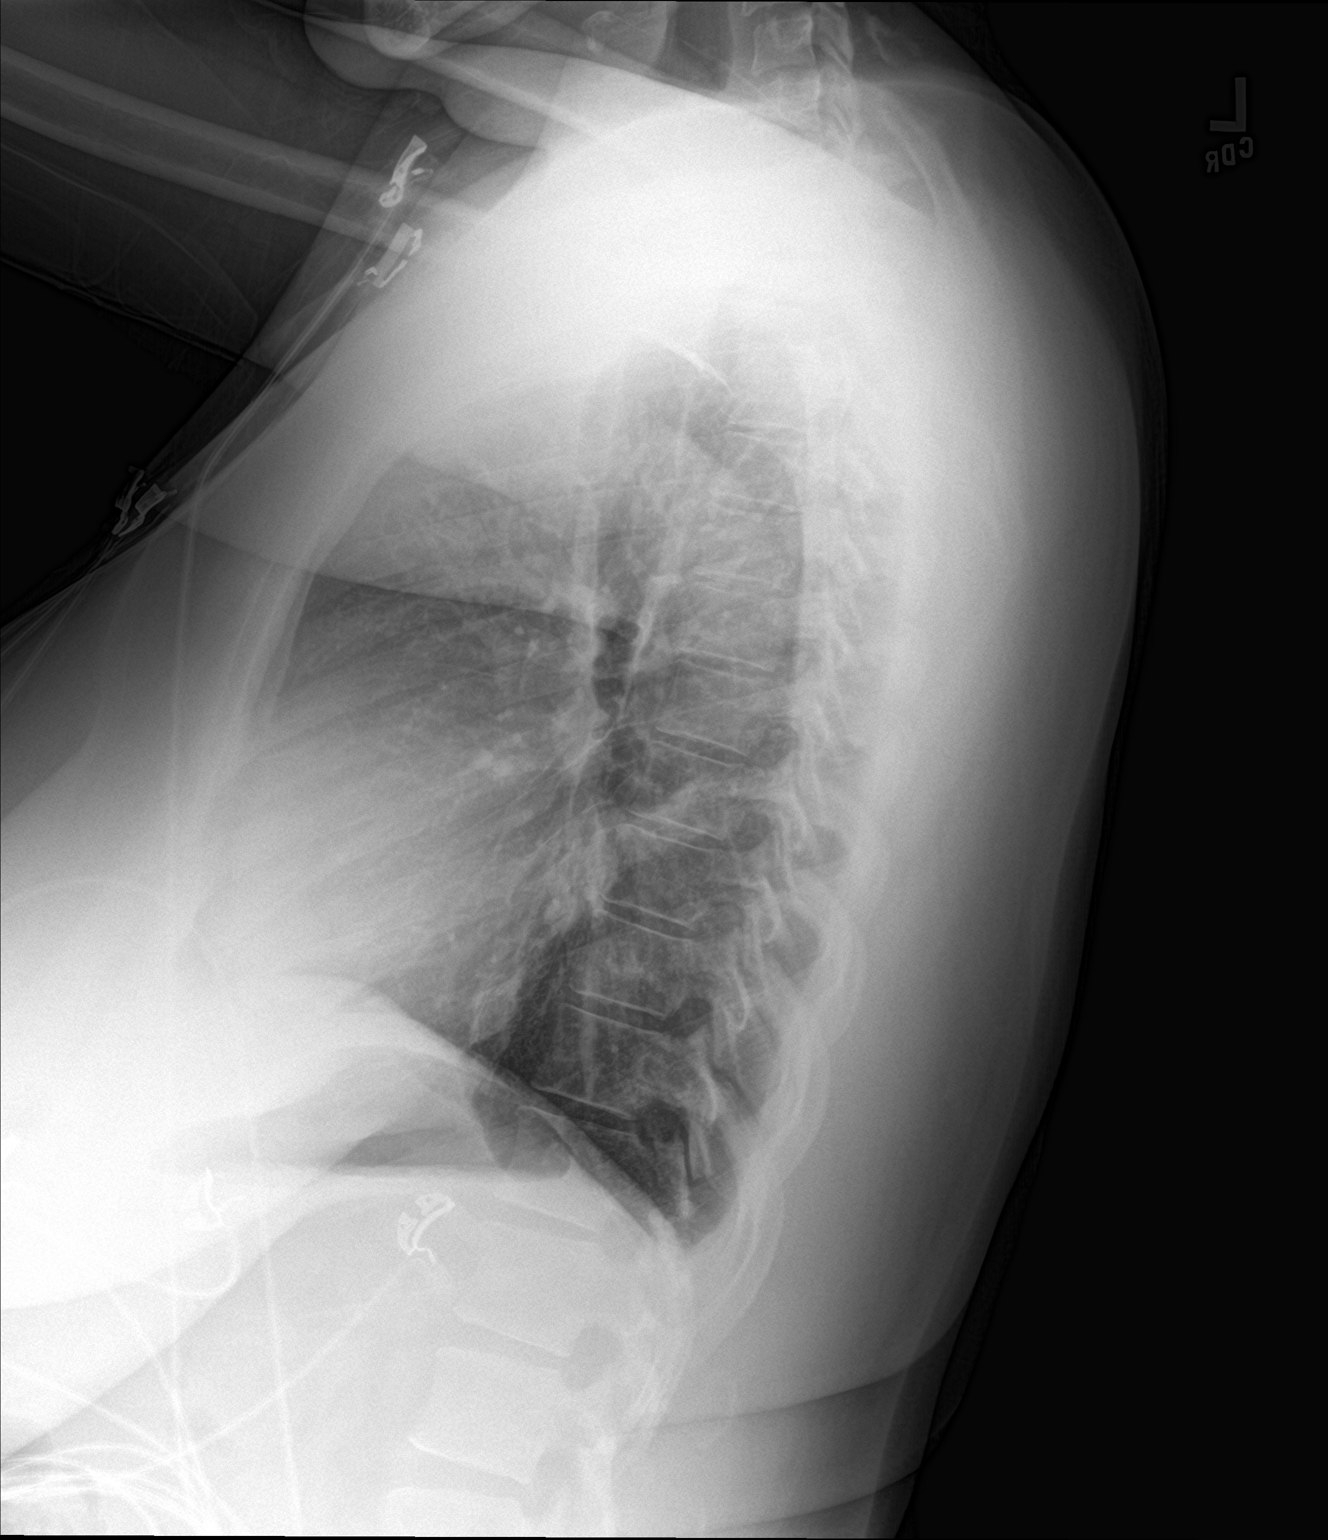

[2 of 2 positions shown; findings below may reference images not displayed]

FINDINGS: The heart size and mediastinal contours are within normal limits.
Both lungs are clear. The visualized skeletal structures are
unremarkable.
IMPRESSION: No active cardiopulmonary disease.

## 2021-12-28 IMAGING — US US ABDOMEN LIMITED
1 series · 13 of 25 positions shown · non-contrast
Comparison: None.

CLINICAL DATA: Elevated liver enzymes

EXAM:
ULTRASOUND ABDOMEN LIMITED RIGHT UPPER QUADRANT

[Series 1: us abdomen limited · 13 of 91 slices shown]
[im 1/91]
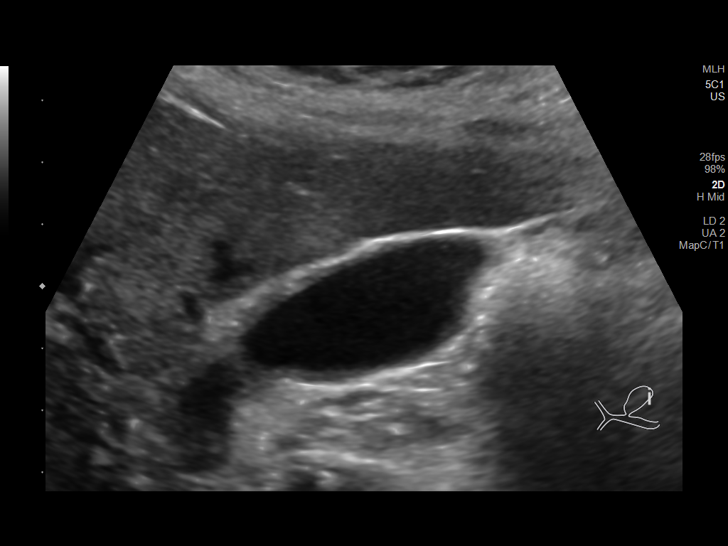
[im 8/91]
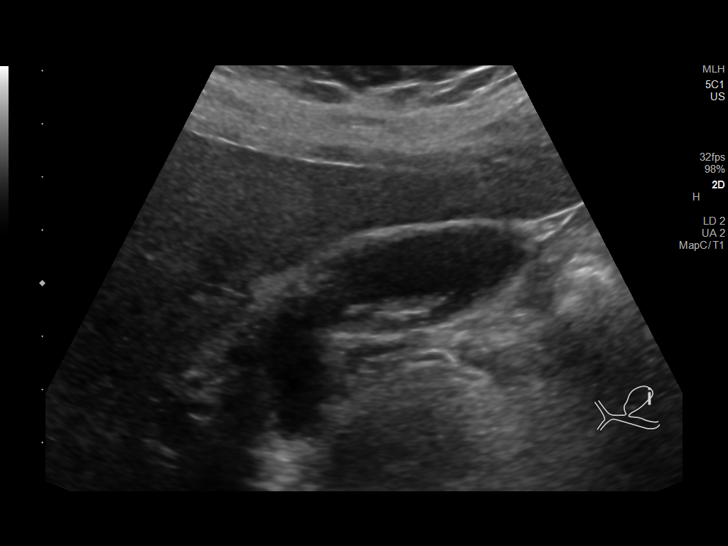
[im 16/91]
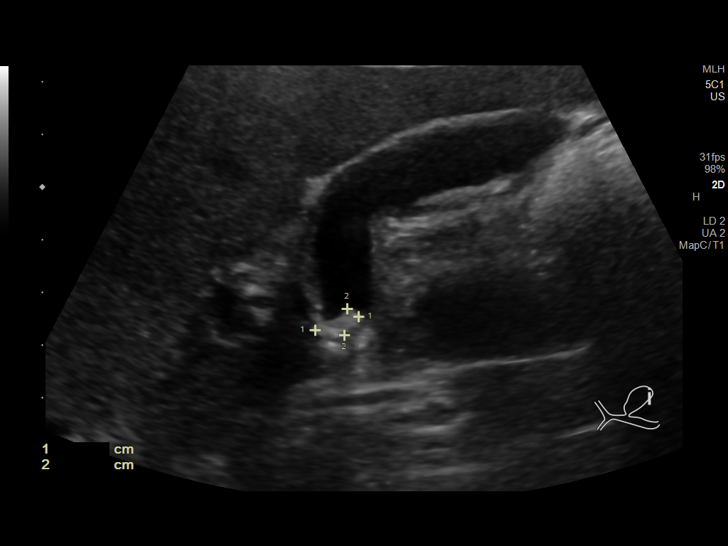
[im 23/91]
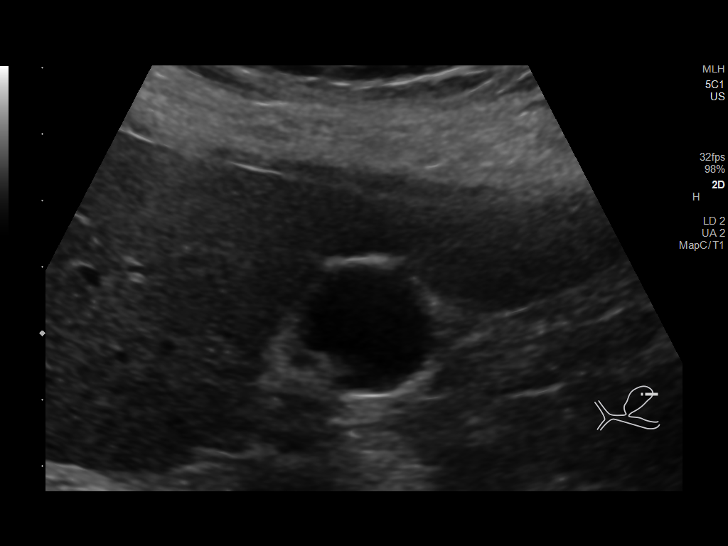
[im 31/91]
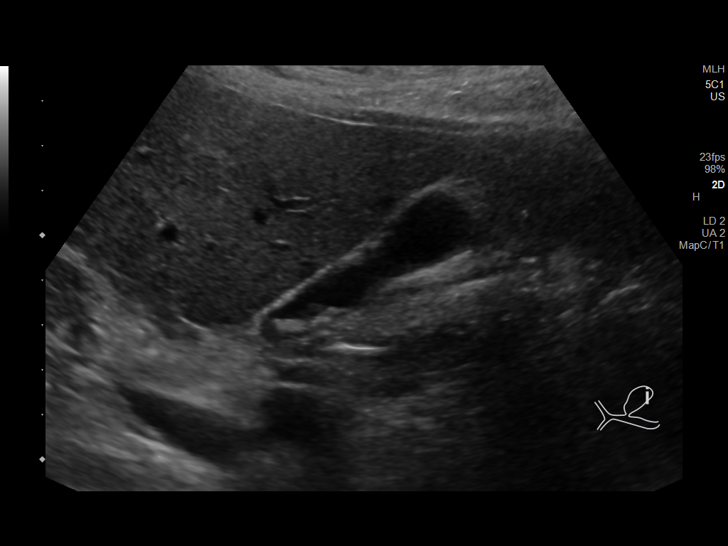
[im 38/91]
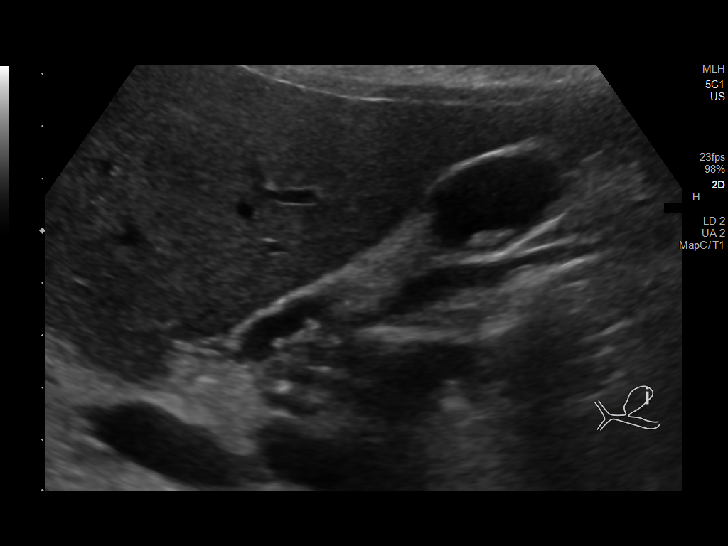
[im 46/91]
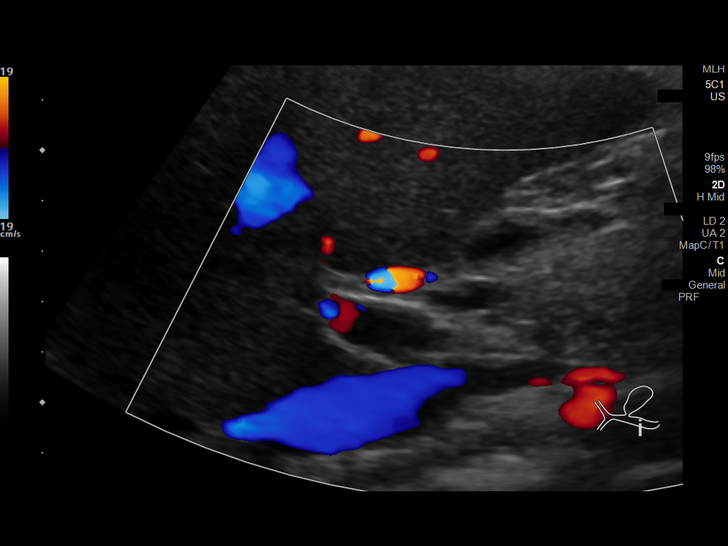
[im 53/91]
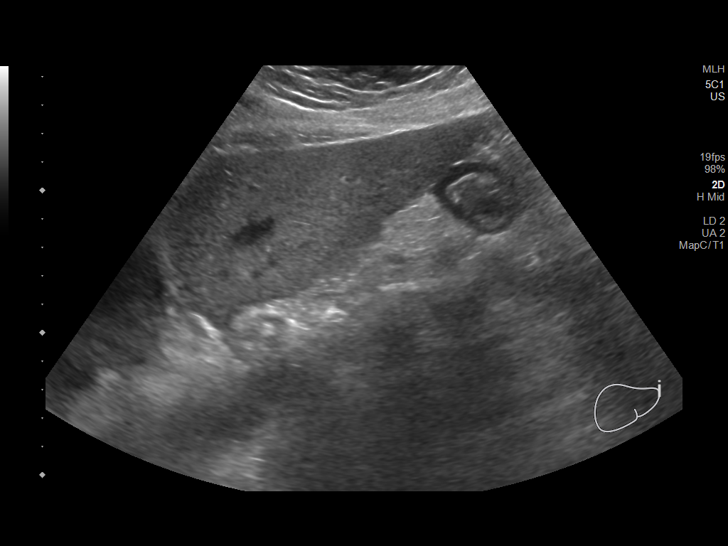
[im 61/91]
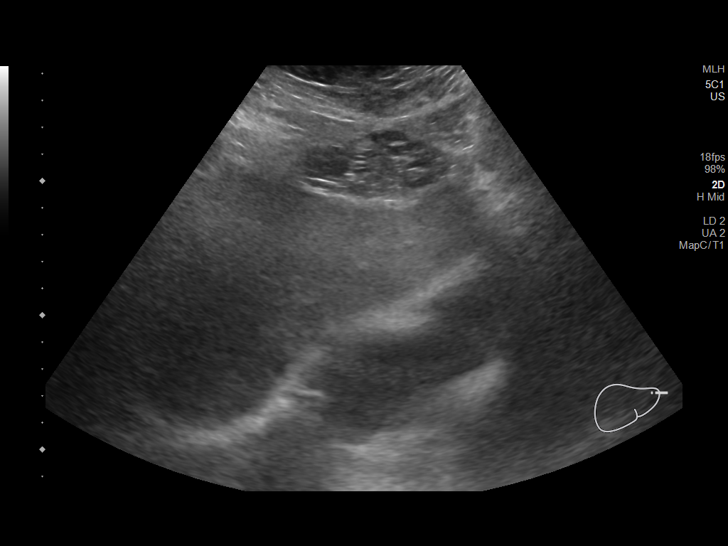
[im 68/91]
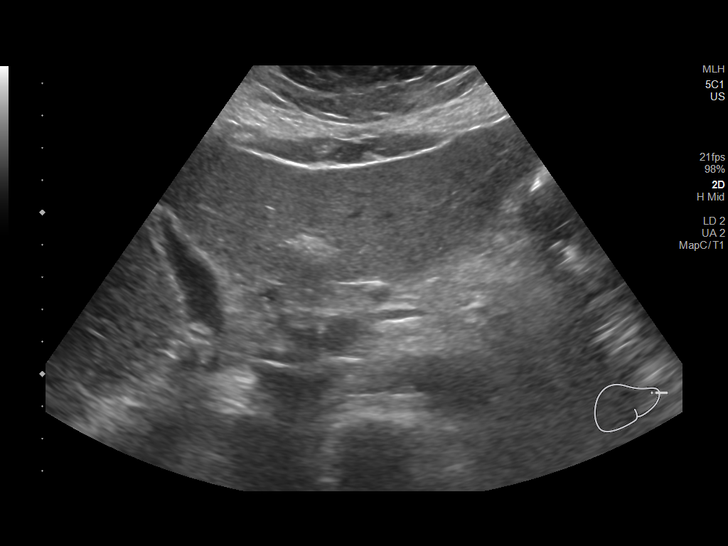
[im 76/91]
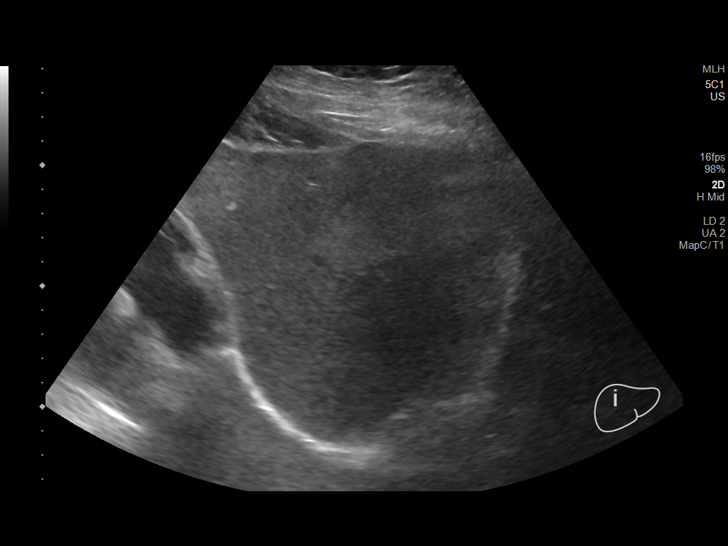
[im 83/91]
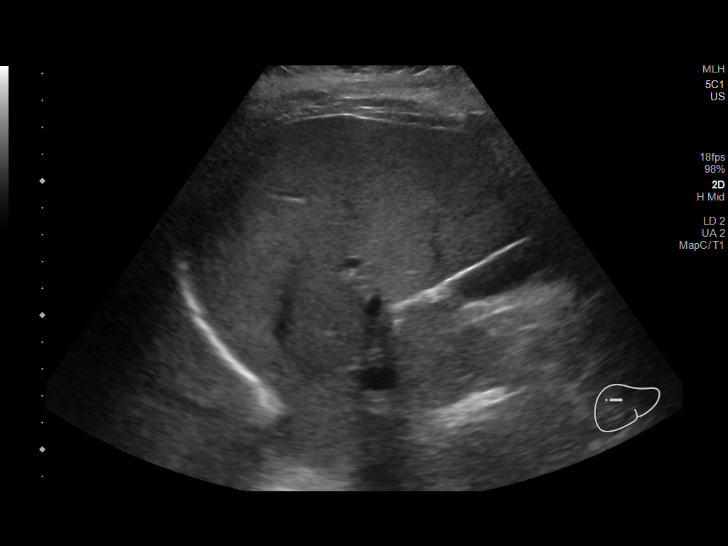
[im 91/91]
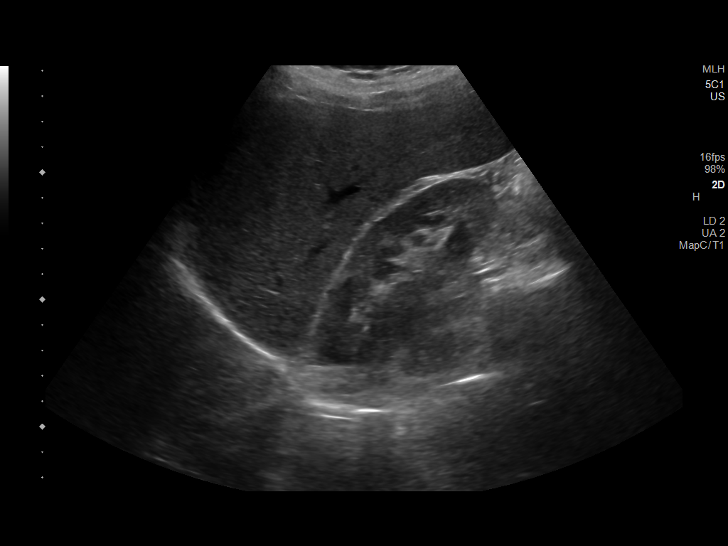

[13 of 25 positions shown; findings below may reference images not displayed]

FINDINGS: Gallbladder:

Within the gallbladder, there are echogenic foci which move and
shadow consistent with cholelithiasis. Largest gallstone measures 8
mm in length. There is mild ring down type artifact suggesting
underlying inflammation of the gallbladder wall such as
adenomyomatosis or cholesterolosis. The gallbladder wall is not
appreciably thickened. There is no biliary duct dilatation. Note
that the patient is focally tender over the gallbladder.

Common bile duct:

Diameter: 5 mm. No intrahepatic or extrahepatic biliary duct
dilatation.

Liver:

No focal lesion identified. Within normal limits in parenchymal
echogenicity. Portal vein is patent on color Doppler imaging with
normal direction of blood flow towards the liver.

Other: None.
IMPRESSION: Cholelithiasis. Suspect a degree of underlying inflammation of the
gallbladder wall such as adenomyomatosis or cholesterolosis. Patient
is focally tender over the gallbladder. These findings raise concern
for potential acute cholecystitis. In this regard, it may be prudent
to correlate with nuclear medicine hepatobiliary imaging study to
assess for cystic duct patency.

Study otherwise unremarkable.

These results will be called to the ordering clinician or
representative by the Radiologist Assistant, and communication
documented in the PACS or [REDACTED].

## 2021-12-28 IMAGING — CT CT ABD-PELV W/ CM
2 of 4 series · 16 of 46 positions shown, 18 images · IV contrast (OMNIPAQUE 300)
Comparison: None.

CLINICAL DATA: 38-year-old female with nausea and vomiting.

EXAM:
CT ABDOMEN AND PELVIS WITH CONTRAST
TECHNIQUE: Multidetector CT imaging of the abdomen and pelvis was performed
using the standard protocol following bolus administration of
intravenous contrast.
CONTRAST:  100mL OMNIPAQUE IOHEXOL 300 MG/ML  SOLN

[Series 2: axial st · axial · 0.68mm/px · z∈[+1477,+1842]mm · 13 of 81 slices shown, 15 images]
[im 4/81  soft-tissue]
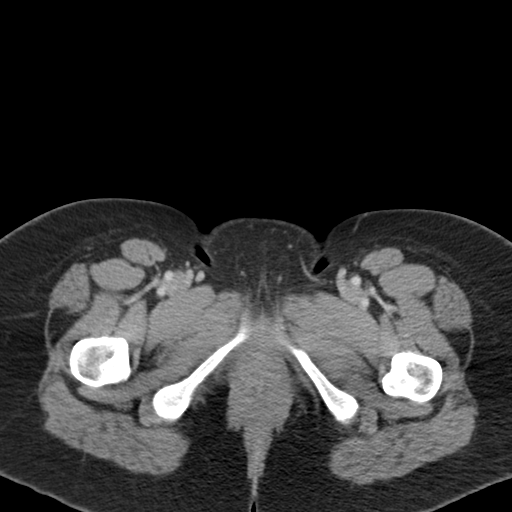
[im 4/81  bone]
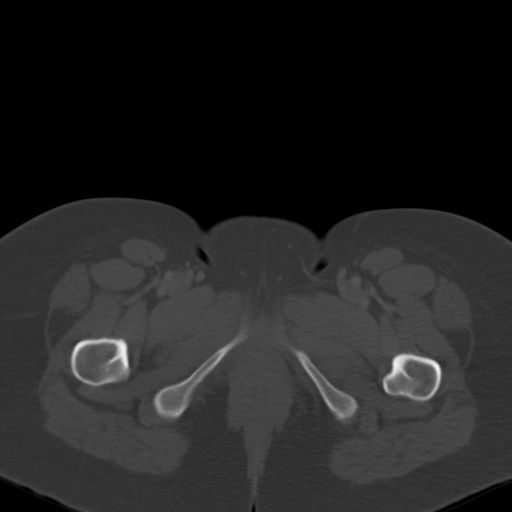
[im 11/81  soft-tissue]
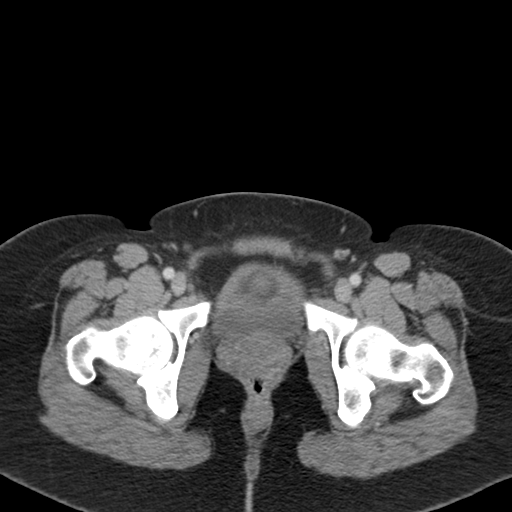
[im 19/81  soft-tissue]
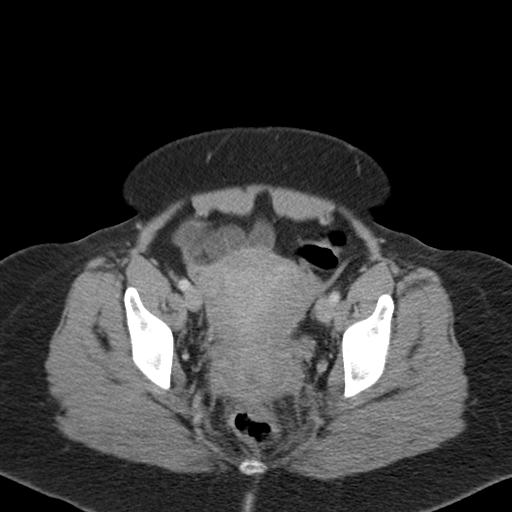
[im 22/81  soft-tissue]
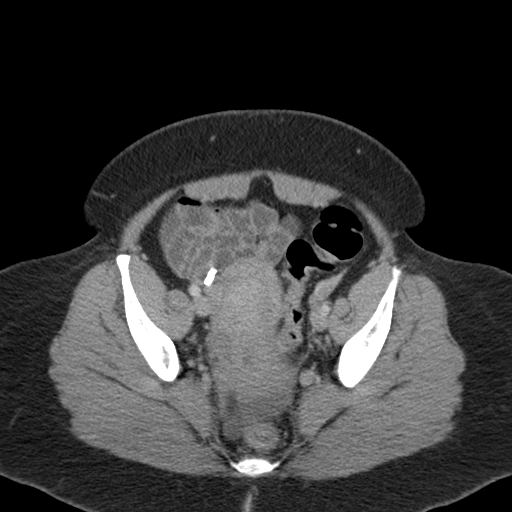
[im 30/81  soft-tissue]
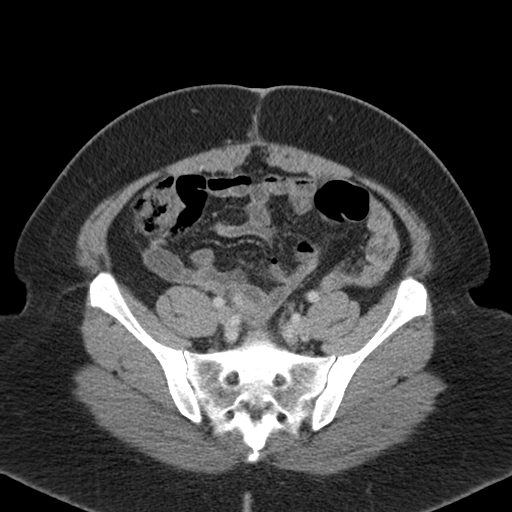
[im 33/81  soft-tissue]
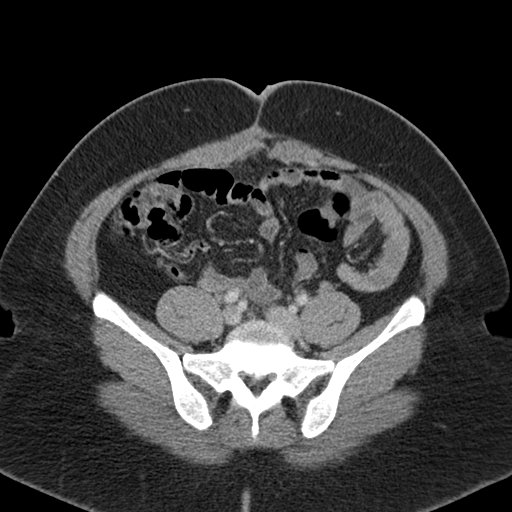
[im 41/81  soft-tissue]
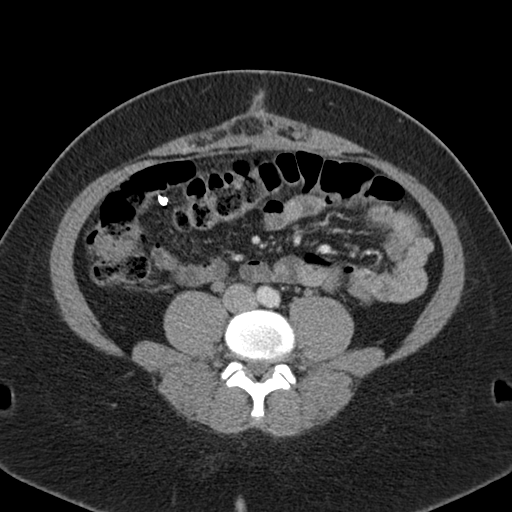
[im 48/81  soft-tissue]
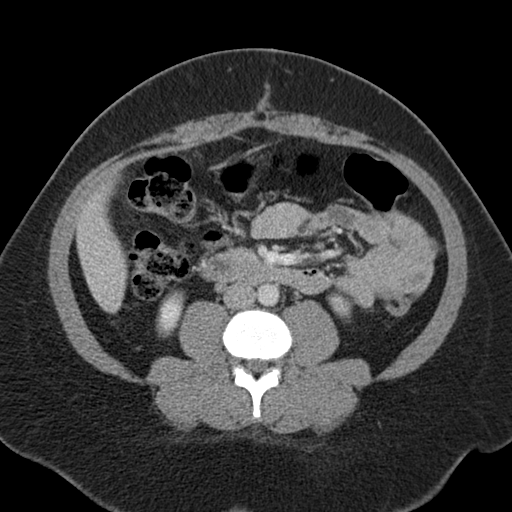
[im 51/81  soft-tissue]
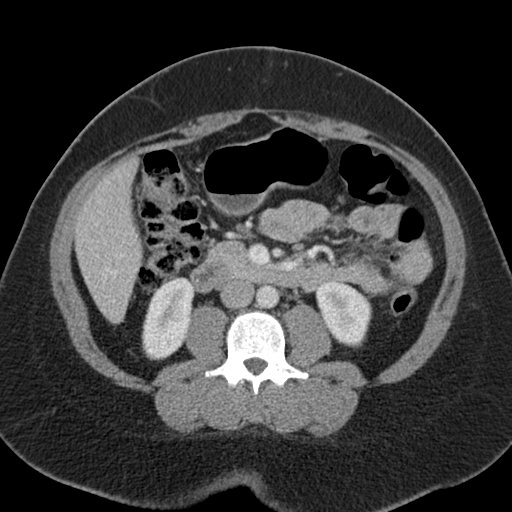
[im 51/81  bone]
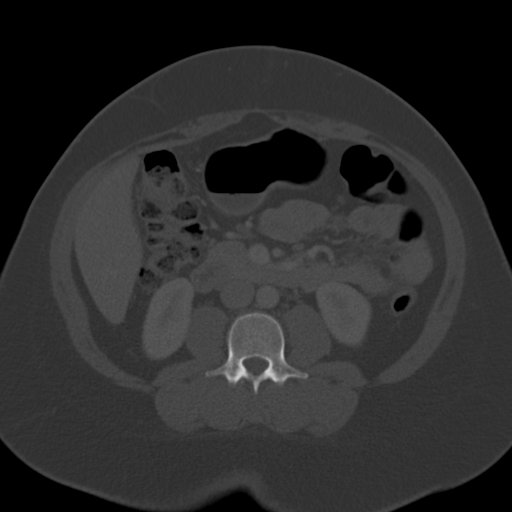
[im 59/81  soft-tissue]
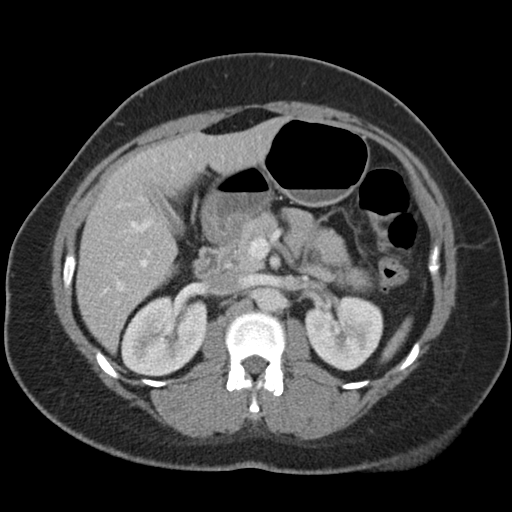
[im 62/81  soft-tissue]
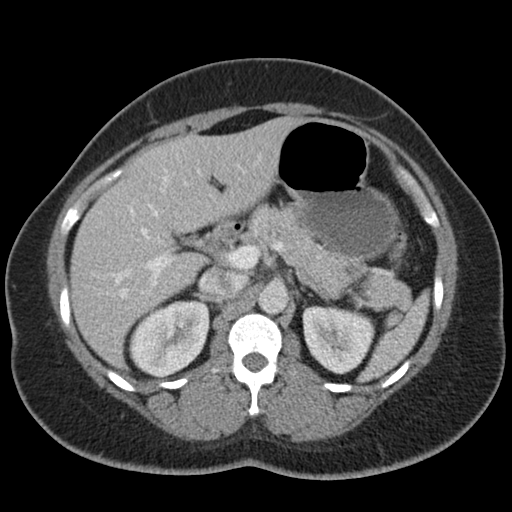
[im 70/81  soft-tissue]
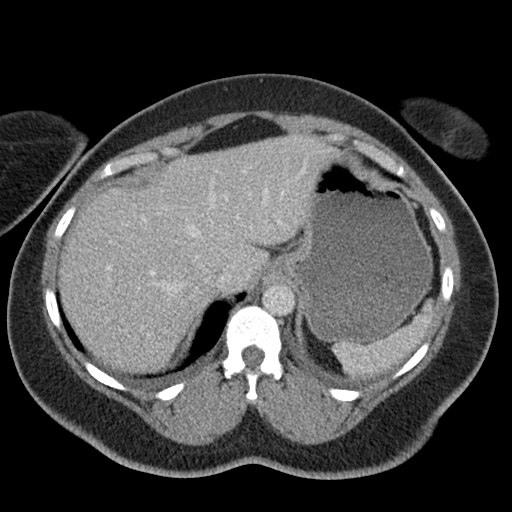
[im 77/81  soft-tissue]
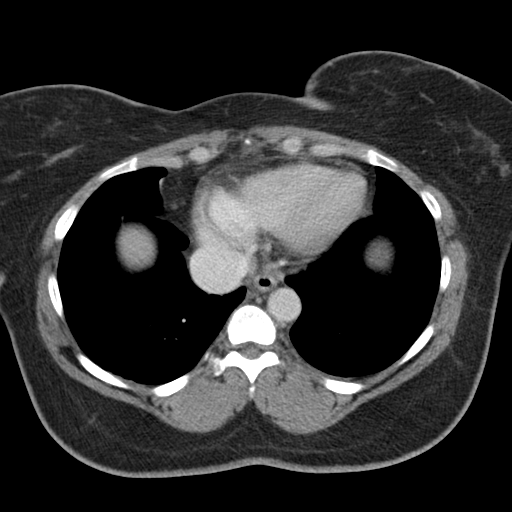

[Series 4: coronal st · coronal · 0.67mm/px · 3 of 158 slices shown]
[im 53/158  soft-tissue]
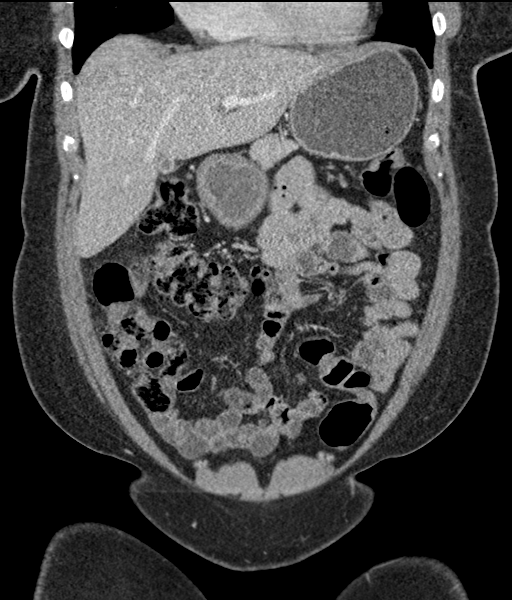
[im 70/158  soft-tissue]
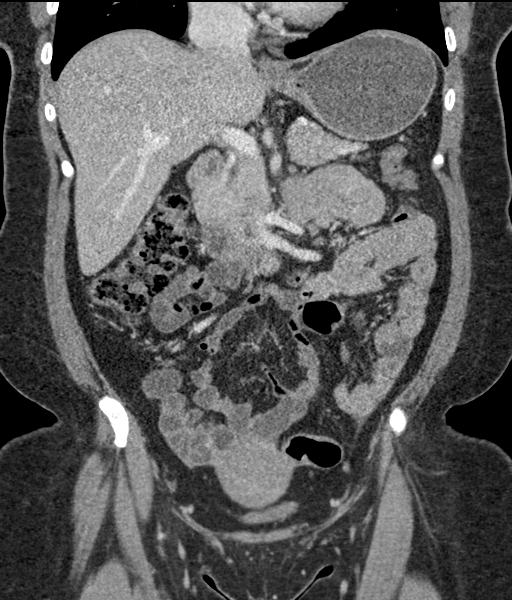
[im 88/158  soft-tissue]
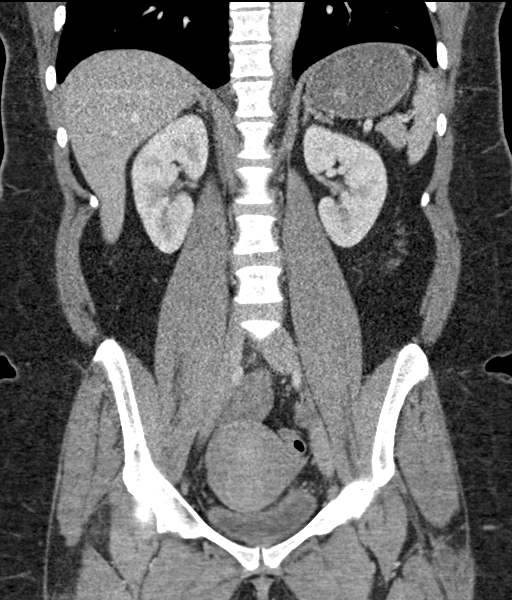

[16 of 46 positions shown; findings below may reference images not displayed]

FINDINGS: Lower chest: There are trace bilateral pleural effusions of
indeterminate etiology. Clinical correlation is recommended. The
visualized lung bases are otherwise clear.

No intra-abdominal free air. Small free fluid within the pelvis.

Hepatobiliary: The liver is unremarkable. The gallbladder is
contracted.

Pancreas: Unremarkable. No pancreatic ductal dilatation or
surrounding inflammatory changes.

Spleen: Normal in size without focal abnormality.

Adrenals/Urinary Tract: The adrenal glands are unremarkable. The
kidneys, visualized ureters, and urinary bladder appear
unremarkable.

Stomach/Bowel: There is no bowel obstruction or active inflammation.
The appendix is normal.

Vascular/Lymphatic: The abdominal aorta and IVC are unremarkable. No
portal venous gas. There is no adenopathy.

Reproductive: The uterus is anteverted. Anterior body fibroid
measures 3.6 cm but suboptimally visualized and poorly evaluated.
There is a right tubal ligation clip. The left tubal ligation clip
is no longer in position and has migrated to the right upper
abdomen.

Other: Small fat containing umbilical hernia. Midline vertical
anterior abdominal wall incisional scar. Ventral hernia repair.

Musculoskeletal: No acute or significant osseous findings.
IMPRESSION: 1. No bowel obstruction. Normal appendix.
2. Trace bilateral pleural effusions of indeterminate etiology.
Clinical correlation is recommended.
3. Migrated left tubal ligation clip to the right upper abdomen.

## 2022-01-18 ENCOUNTER — Encounter (HOSPITAL_BASED_OUTPATIENT_CLINIC_OR_DEPARTMENT_OTHER): Payer: Self-pay | Admitting: Urology

## 2022-01-18 ENCOUNTER — Emergency Department (HOSPITAL_BASED_OUTPATIENT_CLINIC_OR_DEPARTMENT_OTHER)
Admission: EM | Admit: 2022-01-18 | Discharge: 2022-01-18 | Disposition: A | Discharge status: Home / Self Care | Payer: No Typology Code available for payment source | Attending: Emergency Medicine | Admitting: Emergency Medicine

## 2022-01-18 ENCOUNTER — Emergency Department (HOSPITAL_BASED_OUTPATIENT_CLINIC_OR_DEPARTMENT_OTHER): Payer: No Typology Code available for payment source

## 2022-01-18 ENCOUNTER — Other Ambulatory Visit: Payer: Self-pay

## 2022-01-18 DIAGNOSIS — S63652A Sprain of metacarpophalangeal joint of right middle finger, initial encounter: Secondary | ICD-10-CM | POA: Insufficient documentation

## 2022-01-18 DIAGNOSIS — Z1152 Encounter for screening for COVID-19: Secondary | ICD-10-CM | POA: Insufficient documentation

## 2022-01-18 DIAGNOSIS — J40 Bronchitis, not specified as acute or chronic: Secondary | ICD-10-CM | POA: Diagnosis not present

## 2022-01-18 DIAGNOSIS — J45909 Unspecified asthma, uncomplicated: Secondary | ICD-10-CM | POA: Diagnosis not present

## 2022-01-18 DIAGNOSIS — S6991XA Unspecified injury of right wrist, hand and finger(s), initial encounter: Secondary | ICD-10-CM | POA: Diagnosis present

## 2022-01-18 DIAGNOSIS — J208 Acute bronchitis due to other specified organisms: Secondary | ICD-10-CM

## 2022-01-18 DIAGNOSIS — Z7982 Long term (current) use of aspirin: Secondary | ICD-10-CM | POA: Insufficient documentation

## 2022-01-18 DIAGNOSIS — X58XXXA Exposure to other specified factors, initial encounter: Secondary | ICD-10-CM | POA: Insufficient documentation

## 2022-01-18 DIAGNOSIS — I10 Essential (primary) hypertension: Secondary | ICD-10-CM | POA: Insufficient documentation

## 2022-01-18 DIAGNOSIS — Z7951 Long term (current) use of inhaled steroids: Secondary | ICD-10-CM | POA: Diagnosis not present

## 2022-01-18 DIAGNOSIS — Z79899 Other long term (current) drug therapy: Secondary | ICD-10-CM | POA: Diagnosis not present

## 2022-01-18 LAB — RESP PANEL BY RT-PCR (FLU A&B, COVID) ARPGX2
Influenza A by PCR: NEGATIVE
Influenza B by PCR: NEGATIVE
SARS Coronavirus 2 by RT PCR: NEGATIVE

## 2022-01-18 MED ORDER — BENZONATATE 100 MG PO CAPS: 100.0000 mg | ORAL_CAPSULE | Freq: Three times a day (TID) | ORAL | 0 refills | Status: AC

## 2022-01-18 NOTE — ED Triage Notes (Signed)
Pt has hoarse voice x 1 week  Reports dry cough worsening Rib tenderness from coughing  Denies fever   Also reports pain in right middle finger after hitting it yesterday

## 2022-01-18 NOTE — Discharge Instructions (Signed)
You were seen in the emergency department for your cough and your finger pain.  You tested negative for COVID and flu and your chest x-ray showed no signs of pneumonia.  You likely have another viral infection causing your symptoms.  I have given you some cough medicine that you can take as needed for your cough and you should also try raw honey to help with your cough.  Your x-ray of your hand showed no broken bones you likely sprained your finger and you can buddy tape it, ice it and take Tylenol and Motrin for pain.  You should follow-up with your primary doctor in the next few days to have your symptoms rechecked and you should return to the emergency department if you are having significantly worsening shortness of breath, worsening chest pain or if you have any other new or concerning symptoms.

## 2022-01-18 NOTE — ED Provider Notes (Signed)
[facility] EMERGENCY DEPARTMENT Provider Note   CSN: [id number] Arrival date & time: 01/18/22  1950     History  No chief complaint on file.   Ashley Bell is a 41 y.o. female.  Patient is a 41 year old female with a past medical history of asthma, hypertension and migraines presenting to the emergency department with cough and hoarse voice.  Patient states that she has had a cough for approximately the last week as well as a hoarse voice.  She denies any sore throat.  She denies any fevers or chills.  She denies any shortness of breath.  She states that she only gets chest pain when she develops coughing spells.  She states that she has been taking her inhalers as prescribed without significant relief.  She states that she had 1 episode of posttussive emesis but otherwise denies any nausea or diarrhea.  Patient also reports that yesterday she jammed her right middle finger in the car door and has been having pain around her MCP joint since.  The history is provided by the patient.       Home Medications Prior to Admission medications   Medication Sig Start Date End Date Taking? Authorizing Provider  benzonatate (TESSALON) 100 MG capsule Take 1 capsule (100 mg total) by mouth every 8 (eight) hours. 01/18/22  Yes Theresia Lo, Benetta Spar K, DO  albuterol (VENTOLIN HFA) 108 (90 Base) MCG/ACT inhaler Inhale 2 puffs into the lungs every 6 (six) hours as needed for wheezing or shortness of breath.    [provider]  amLODipine (NORVASC) 5 MG tablet Take 5 mg by mouth daily.    [provider]  ascorbic acid (VITAMIN C) 250 MG tablet Take 250 mg by mouth daily.    [provider]  busPIRone (BUSPAR) 10 MG tablet Take 10 mg by mouth 2 (two) times daily.    [provider]  butalbital-acetaminophen-caffeine (FIORICET) 50-325-40 MG tablet Take 2 tablets by mouth 2 (two) times daily as needed for headache.     [provider]   carboxymethylcellulose (REFRESH PLUS) 0.5 % SOLN Place 1 drop into both eyes 4 (four) times daily as needed (dry eyes).    [provider]  cholecalciferol (VITAMIN D3) 25 MCG (1000 UNIT) tablet Take 2,000 Units by mouth daily.    [provider]  diclofenac Sodium (VOLTAREN) 1 % GEL Apply 2 g topically 2 (two) times daily as needed (knee pain).    [provider]  docusate sodium (COLACE) 100 MG capsule Take 200 mg by mouth 2 (two) times daily.    [provider]  ferrous sulfate 325 (65 FE) MG tablet Take 325 mg by mouth 2 (two) times daily with a meal.    [provider]  fluticasone (FLONASE) 50 MCG/ACT nasal spray Place 2 sprays into both nostrils daily. 05/17/21   Palumbo, April, MD  gabapentin (NEURONTIN) 300 MG capsule Take 300 mg by mouth 3 (three) times daily as needed (pain).    [provider]  HYDROcodone-acetaminophen (NORCO/VICODIN) 5-325 MG tablet Take 1 tablet by mouth every 4 (four) hours as needed. 07/13/19   Bethel Born, PA-C  ibuprofen (ADVIL) 400 MG tablet Take 1 tablet (400 mg total) by mouth every 6 (six) hours as needed. 05/17/21   Palumbo, April, MD  ibuprofen (ADVIL) 800 MG tablet Take 800 mg by mouth every 8 (eight) hours as needed for fever, headache or moderate pain.    [provider]  ketoconazole (NIZORAL) 2 %  shampoo Apply 1 application topically 3 (three) times a week. Leave on affected area for 10 minutes, then rinse off with water    [provider]  loratadine (CLARITIN) 10 MG tablet Take 10 mg by mouth daily.    [provider]  ondansetron (ZOFRAN ODT) 4 MG disintegrating tablet Take 1 tablet (4 mg total) by mouth every 8 (eight) hours as needed for nausea or vomiting. 07/13/19   Bethel Born, PA-C  oxyCODONE-acetaminophen (PERCOCET) 10-325 MG tablet Take 1 tablet by mouth every 6 (six) hours as needed for pain (may cause constipation). 06/27/19   Molpus, John, MD   rosuvastatin (CRESTOR) 40 MG tablet Take 40 mg by mouth daily.    [provider]  sertraline (ZOLOFT) 100 MG tablet Take 200 mg by mouth daily.    [provider]  valACYclovir (VALTREX) 1000 MG tablet Take 1 tablet (1,000 mg total) by mouth 3 (three) times daily. 06/27/19   Molpus, John, MD  SUMAtriptan (IMITREX) 6 MG/0.5ML SOLN Inject 6 mg into the skin every 2 (two) hours as needed. For migraine    01/08/15  [provider]      Allergies    Other, Sumatriptan, and Lisinopril    Review of Systems   Review of Systems  Physical Exam Updated Vital Signs BP (!) 156/99   Pulse 97   Temp 98.2 F (36.8 C) (Oral)   Resp 17   Ht 5\' 1"  (1.549 m)   Wt 93.4 kg   SpO2 94%   BMI 38.91 kg/m  Physical Exam Vitals and nursing note reviewed.  Constitutional:      General: She is not in acute distress.    Appearance: Normal appearance.     Comments: Actively coughing on exam  HENT:     Head: Normocephalic and atraumatic.     Nose: Nose normal.     Mouth/Throat:     Mouth: Mucous membranes are moist.     Pharynx: Oropharynx is clear.     Comments: Hoarse voice Eyes:     Extraocular Movements: Extraocular movements intact.     Conjunctiva/sclera: Conjunctivae normal.  Cardiovascular:     Rate and Rhythm: Normal rate and regular rhythm.     Heart sounds: Normal heart sounds.  Pulmonary:     Effort: Pulmonary effort is normal.     Breath sounds: Normal breath sounds. No wheezing.  Abdominal:     General: Abdomen is flat.     Palpations: Abdomen is soft.     Tenderness: There is no abdominal tenderness.  Musculoskeletal:        General: Normal range of motion.     Cervical back: Normal range of motion and neck supple.     Comments: Mild tenderness to palpation of R 3rd MCP joint with no overlying swelling, full flexion/extension of MCP, PIP and DIP joints  Skin:    General: Skin is warm and dry.  Neurological:     General: No focal deficit present.      Mental Status: She is alert and oriented to person, place, and time.     Sensory: No sensory deficit.     Motor: No weakness.  Psychiatric:        Mood and Affect: Mood normal.        Behavior: Behavior normal.     ED Results / Procedures / Treatments   Labs (all labs ordered are listed, but only abnormal results are displayed) Labs Reviewed  RESP PANEL BY  RT-PCR (FLU A&B, COVID) ARPGX2    EKG None  Radiology DG Chest 2 View  Result Date: 01/18/2022 CLINICAL DATA:  cough EXAM: CHEST - 2 VIEW COMPARISON:  07/13/2019 FINDINGS: Cardiac silhouette is unremarkable. No pneumothorax or pleural effusion. The lungs are clear. The visualized skeletal structures are unremarkable. IMPRESSION: No acute cardiopulmonary process. Electronically Signed   By: Layla Maw M.D.   On: 01/18/2022 20:38   DG Finger Middle Right  Result Date: 01/18/2022 CLINICAL DATA:  Trauma EXAM: RIGHT MIDDLE FINGER 3V COMPARISON:  None Available. FINDINGS: There is no evidence of fracture or dislocation. There is no evidence of arthropathy or other focal bone abnormality. Soft tissues are unremarkable. IMPRESSION: Negative. Electronically Signed   By: Layla Maw M.D.   On: 01/18/2022 20:36    Procedures Procedures    Medications Ordered in ED Medications - No data to display  ED Course/ Medical Decision Making/ A&P                           Medical Decision Making This patient presents to the ED with chief complaint(s) of cough and finger pain with pertinent past medical history of asthma, HTN, migraines which further complicates the presenting complaint. The complaint involves an extensive differential diagnosis and also carries with it a high risk of complications and morbidity.    The differential diagnosis includes patient has no wheezing on exam making asthma exacerbation unlikely, considering pneumonia, bronchitis, viral syndrome, no tonsillar swelling or exudates making tonsillitis unlikely,  no evidence of Ludwig's, PTA or RPA on exam  Additional history obtained: Additional history obtained from N/A Records reviewed N/A  ED Course and Reassessment: Patient was initially evaluated by triage and had chest x-ray, right finger x-ray and viral swab performed.  Her x-rays were negative for acute disease and her viral swab was negative.  She has no wheezing on exam making asthma exacerbation unlikely.  Patient likely has a viral bronchitis and is stable for discharge home with primary care follow-up.  She will have her finger buddy taped.  Independent labs interpretation:  The following labs were independently interpreted: Negative viral panel  Independent visualization of imaging: - I independently visualized the following imaging with scope of interpretation limited to determining acute life threatening conditions related to emergency care: Chest x-ray, hand x-ray, which revealed no acute disease  Consultation: - Consulted or discussed management/test interpretation w/ external professional: N/A  Consideration for admission or further workup: Patient has no emergent conditions requiring admission or further work-up at this time and is stable for discharge home with primary care follow-up  Social Determinants of health: N/A    Amount and/or Complexity of Data Reviewed Radiology: ordered.          Final Clinical Impression(s) / ED Diagnoses Final diagnoses:  Viral bronchitis  Sprain of metacarpophalangeal (MCP) joint of right middle finger, initial encounter    Rx / DC Orders ED Discharge Orders          Ordered    benzonatate (TESSALON) 100 MG capsule  Every 8 hours        01/18/22 2238              Rexford Maus, DO 01/18/22 2240

## 2022-01-31 ENCOUNTER — Other Ambulatory Visit: Payer: Self-pay

## 2022-01-31 ENCOUNTER — Emergency Department (HOSPITAL_BASED_OUTPATIENT_CLINIC_OR_DEPARTMENT_OTHER)
Admission: EM | Admit: 2022-01-31 | Discharge: 2022-01-31 | Disposition: A | Discharge status: Home / Self Care | Payer: No Typology Code available for payment source | Attending: Emergency Medicine | Admitting: Emergency Medicine

## 2022-01-31 DIAGNOSIS — Z1152 Encounter for screening for COVID-19: Secondary | ICD-10-CM | POA: Insufficient documentation

## 2022-01-31 DIAGNOSIS — J210 Acute bronchiolitis due to respiratory syncytial virus: Secondary | ICD-10-CM | POA: Insufficient documentation

## 2022-01-31 DIAGNOSIS — R051 Acute cough: Secondary | ICD-10-CM

## 2022-01-31 DIAGNOSIS — R059 Cough, unspecified: Secondary | ICD-10-CM | POA: Diagnosis present

## 2022-01-31 LAB — RESP PANEL BY RT-PCR (RSV, FLU A&B, COVID)  RVPGX2
Influenza A by PCR: NEGATIVE
Influenza B by PCR: NEGATIVE
Resp Syncytial Virus by PCR: POSITIVE — AB
SARS Coronavirus 2 by RT PCR: NEGATIVE

## 2022-01-31 LAB — GROUP A STREP BY PCR: Group A Strep by PCR: NOT DETECTED

## 2022-01-31 MED ORDER — KETOROLAC TROMETHAMINE 30 MG/ML IJ SOLN
30.0000 mg | Freq: Once | INTRAMUSCULAR | Status: AC
  Administered 2022-01-31: 30 mg via INTRAMUSCULAR
  Filled 2022-01-31: qty 1

## 2022-01-31 MED ORDER — HYDROCOD POLI-CHLORPHE POLI ER 10-8 MG/5ML PO SUER: 5.0000 mL | Freq: Two times a day (BID) | ORAL | 0 refills | Status: DC | PRN

## 2022-01-31 MED ORDER — HYDROCOD POLI-CHLORPHE POLI ER 10-8 MG/5ML PO SUER: 5.0000 mL | Freq: Two times a day (BID) | ORAL | 0 refills | Status: AC | PRN
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
## 2022-01-31 NOTE — Discharge Instructions (Addendum)
Please read and follow all provided instructions.  Your diagnoses today include:  1. RSV (acute bronchiolitis due to respiratory syncytial virus)   2. Acute cough    You appear to have an upper respiratory infection (URI). An upper respiratory tract infection, or cold, is a viral infection of the air passages leading to the lungs. It should improve gradually after 5-7 days. You may have a lingering cough that lasts for 2- 4 weeks after the infection.  Tests performed today include: Vital signs. See below for your results today.  Flu, COVID, RSV testing: Positive RSV  Medications prescribed:  Tussinex - narcotic cough suppressant syrup  You have been prescribed narcotic cough suppressant such as Tussinex: DO NOT drive or perform any activities that require you to be awake and alert because this medicine can make you drowsy.   Take any prescribed medications only as directed. Treatment for your infection is aimed at treating the symptoms. There are no medications, such as antibiotics, that will cure your infection.   Home care instructions:  You can take Tylenol and/or Ibuprofen as directed on the packaging for fever reduction and pain relief.    For cough: honey 1/2 to 1 teaspoon (you can dilute the honey in water or another fluid).  You can also use guaifenesin and dextromethorphan for cough. You can use a humidifier for chest congestion and cough.  If you don't have a humidifier, you can sit in the bathroom with the hot shower running.      For sore throat: try warm salt water gargles, cepacol lozenges, throat spray, warm tea or water with lemon/honey, popsicles or ice, or OTC cold relief medicine for throat discomfort.    For congestion: take a daily anti-histamine like Zyrtec, Claritin, and a oral decongestant, such as pseudoephedrine.  You can also use Flonase 1-2 sprays in each nostril daily.    It is important to stay hydrated: drink plenty of fluids (water,  gatorade/powerade/pedialyte, juices, or teas) to keep your throat moisturized and help further relieve irritation/discomfort.   Your illness is contagious and can be spread to others, especially during the first 3 or 4 days. It cannot be cured by antibiotics or other medicines. Take basic precautions such as washing your hands often, covering your mouth when you cough or sneeze, and avoiding public places where you could spread your illness to others.   Please continue drinking plenty of fluids.  Use over-the-counter medicines as needed as directed on packaging for symptom relief.  You may also use ibuprofen or tylenol as directed on packaging for pain or fever.  Do not take multiple medicines containing Tylenol or acetaminophen to avoid taking too much of this medication.  Follow-up instructions: Please follow-up with your primary care provider in the next 3 days for further evaluation of your symptoms if you are not feeling better.   Return instructions:  Please return to the Emergency Department if you experience worsening symptoms.  RETURN IMMEDIATELY IF you develop shortness of breath, confusion or altered mental status, a new rash, become dizzy, faint, or poorly responsive, or are unable to be cared for at home. Please return if you have persistent vomiting and cannot keep down fluids or develop a fever that is not controlled by tylenol or motrin.   Please return if you have any other emergent concerns.  Additional Information:  Your vital signs today were: BP (!) 160/110 (BP Location: Left Arm)   Pulse 83   Temp 99.5 F (37.5 C) (Oral)  Resp 18   Ht 5\' 1"  (1.549 m)   Wt 93.4 kg   LMP 01/30/2022 (Exact Date)   SpO2 94%   BMI 38.91 kg/m  If your blood pressure (BP) was elevated above 135/85 this visit, please have this repeated by your doctor within one month. --------------

## 2022-01-31 NOTE — ED Triage Notes (Signed)
Pt complains of body aches, ear pain, sore throat x 3 weeks. Was seen here a few weeks ago and dx with bronchitis but states cough is worse. OTC meds are not helping.

## 2022-01-31 NOTE — ED Triage Notes (Signed)
PT has not taken BP meds in 4 days because of the pain to swallow.

## 2022-02-08 ENCOUNTER — Other Ambulatory Visit: Payer: Self-pay

## 2022-09-25 ENCOUNTER — Emergency Department (HOSPITAL_COMMUNITY)
Admission: EM | Admit: 2022-09-25 | Discharge: 2022-09-26 | Disposition: A | Discharge status: Home / Self Care | Payer: No Typology Code available for payment source | Attending: Emergency Medicine | Admitting: Emergency Medicine

## 2022-09-25 ENCOUNTER — Other Ambulatory Visit: Payer: Self-pay

## 2022-09-25 DIAGNOSIS — W3400XA Accidental discharge from unspecified firearms or gun, initial encounter: Secondary | ICD-10-CM | POA: Insufficient documentation

## 2022-09-25 DIAGNOSIS — S61402A Unspecified open wound of left hand, initial encounter: Secondary | ICD-10-CM | POA: Insufficient documentation

## 2022-09-25 MED ORDER — ACETAMINOPHEN 325 MG PO TABS
650.0000 mg | ORAL_TABLET | Freq: Four times a day (QID) | ORAL | Status: DC | PRN
  Administered 2022-09-25: 650 mg via ORAL
  Filled 2022-09-25: qty 2
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
## 2022-09-26 NOTE — ED Notes (Signed)
This RN was unable to assess the pt d/t pt leaving after the provider talking to them.
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
## 2022-09-26 NOTE — Discharge Instructions (Signed)
Please call Dr. Bari Edward office number soon as they open to see if you can get a urgent evaluation.  Come back to the emergency department if you develop chest pain, shortness of breath, severe abdominal pain, uncontrolled nausea, vomiting, diarrhea.

## 2023-08-23 ENCOUNTER — Emergency Department (HOSPITAL_COMMUNITY)

## 2023-08-23 ENCOUNTER — Encounter (HOSPITAL_COMMUNITY): Payer: Self-pay

## 2023-08-23 ENCOUNTER — Emergency Department (HOSPITAL_COMMUNITY)
Admission: EM | Admit: 2023-08-23 | Discharge: 2023-08-23 | Disposition: A | Discharge status: Home / Self Care | Attending: Emergency Medicine | Admitting: Emergency Medicine

## 2023-08-23 DIAGNOSIS — M25511 Pain in right shoulder: Secondary | ICD-10-CM | POA: Insufficient documentation

## 2023-08-23 DIAGNOSIS — M79671 Pain in right foot: Secondary | ICD-10-CM | POA: Insufficient documentation

## 2023-08-23 DIAGNOSIS — M25552 Pain in left hip: Secondary | ICD-10-CM | POA: Diagnosis not present

## 2023-08-23 DIAGNOSIS — Y92410 Unspecified street and highway as the place of occurrence of the external cause: Secondary | ICD-10-CM | POA: Insufficient documentation

## 2023-08-23 MED ORDER — METHOCARBAMOL 500 MG PO TABS: 500.0000 mg | ORAL_TABLET | Freq: Two times a day (BID) | ORAL | 0 refills | Status: AC

## 2023-08-23 MED ORDER — NAPROXEN 500 MG PO TABS: 500.0000 mg | ORAL_TABLET | Freq: Two times a day (BID) | ORAL | 0 refills | Status: AC
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
## 2023-08-23 NOTE — Discharge Instructions (Addendum)
